# Patient Record
Sex: Female | Born: 1995 | Race: Black or African American | Hispanic: No | Marital: Single | Smoking: Never smoker
Health system: Southern US, Community
[De-identification: ages and names within clinical notes are randomized; demographics above are authoritative.]

## PROBLEM LIST (undated history)

## (undated) DIAGNOSIS — R011 Cardiac murmur, unspecified: Secondary | ICD-10-CM

## (undated) DIAGNOSIS — T7840XA Allergy, unspecified, initial encounter: Secondary | ICD-10-CM

## (undated) DIAGNOSIS — E785 Hyperlipidemia, unspecified: Secondary | ICD-10-CM

## (undated) DIAGNOSIS — I10 Essential (primary) hypertension: Secondary | ICD-10-CM

## (undated) HISTORY — DX: Cardiac murmur, unspecified: R01.1

## (undated) HISTORY — DX: Allergy, unspecified, initial encounter: T78.40XA

---
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
## 2015-06-22 ENCOUNTER — Ambulatory Visit (INDEPENDENT_AMBULATORY_CARE_PROVIDER_SITE_OTHER): Payer: 59 | Admitting: Family Medicine

## 2015-06-22 VITALS — BP 120/82 | HR 80 | Temp 99.0°F | Resp 16 | Ht 67.5 in | Wt 221.0 lb

## 2015-06-22 DIAGNOSIS — R300 Dysuria: Secondary | ICD-10-CM | POA: Diagnosis not present

## 2015-06-22 DIAGNOSIS — R011 Cardiac murmur, unspecified: Secondary | ICD-10-CM | POA: Diagnosis not present

## 2015-06-22 LAB — POC MICROSCOPIC URINALYSIS (UMFC): MUCUS RE: ABSENT

## 2015-06-22 LAB — POCT URINALYSIS DIP (MANUAL ENTRY)
BILIRUBIN UA: NEGATIVE
BILIRUBIN UA: NEGATIVE
GLUCOSE UA: NEGATIVE
Nitrite, UA: POSITIVE — AB
Protein Ur, POC: 100 — AB
SPEC GRAV UA: 1.015
UROBILINOGEN UA: 1
pH, UA: 7

## 2015-06-22 MED ORDER — NITROFURANTOIN MONOHYD MACRO 100 MG PO CAPS: 100.0000 mg | ORAL_CAPSULE | Freq: Two times a day (BID) | ORAL | Status: DC

## 2015-06-22 MED ORDER — PHENAZOPYRIDINE HCL 100 MG PO TABS: 100.0000 mg | ORAL_TABLET | Freq: Three times a day (TID) | ORAL | Status: DC | PRN

## 2015-06-22 NOTE — Patient Instructions (Addendum)
   IF you received an x-ray today, you will receive an invoice from [facility]. Please contact [facility] at [phone] with questions or concerns regarding your invoice.   IF you received labwork today, you will receive an invoice from [facility]. Please contact [facility] at [phone] with questions or concerns regarding your invoice.   Our billing staff will not be able to assist you with questions regarding bills from these companies.  You will be contacted with the lab results as soon as they are available. The fastest way to get your results is to activate your My Chart account. Instructions are located on the last page of this paperwork. If you have not heard from us regarding the results in 2 weeks, please contact this office.     Urinary Tract Infection Urinary tract infections (UTIs) can develop anywhere along your urinary tract. Your urinary tract is your body's drainage system for removing wastes and extra water. Your urinary tract includes two kidneys, two ureters, a bladder, and a urethra. Your kidneys are a pair of bean-shaped organs. Each kidney is about the size of your fist. They are located below your ribs, one on each side of your spine. CAUSES Infections are caused by microbes, which are microscopic organisms, including fungi, viruses, and bacteria. These organisms are so small that they can only be seen through a microscope. Bacteria are the microbes that most commonly cause UTIs. SYMPTOMS  Symptoms of UTIs may vary by age and gender of the patient and by the location of the infection. Symptoms in young women typically include a frequent and intense urge to urinate and a painful, burning feeling in the bladder or urethra during urination. Older women and men are more likely to be tired, shaky, and weak and have muscle aches and abdominal pain. A fever may mean the infection is in your kidneys. Other symptoms of a kidney  infection include pain in your back or sides below the ribs, nausea, and vomiting. DIAGNOSIS To diagnose a UTI, your caregiver will ask you about your symptoms. Your caregiver will also ask you to provide a urine sample. The urine sample will be tested for bacteria and white blood cells. White blood cells are made by your body to help fight infection. TREATMENT  Typically, UTIs can be treated with medication. Because most UTIs are caused by a bacterial infection, they usually can be treated with the use of antibiotics. The choice of antibiotic and length of treatment depend on your symptoms and the type of bacteria causing your infection. HOME CARE INSTRUCTIONS  If you were prescribed antibiotics, take them exactly as your caregiver instructs you. Finish the medication even if you feel better after you have only taken some of the medication.  Drink enough water and fluids to keep your urine clear or pale yellow.  Avoid caffeine, tea, and carbonated beverages. They tend to irritate your bladder.  Empty your bladder often. Avoid holding urine for long periods of time.  Empty your bladder before and after sexual intercourse.  After a bowel movement, women should cleanse from front to back. Use each tissue only once. SEEK MEDICAL CARE IF:   You have back pain.  You develop a fever.  Your symptoms do not begin to resolve within 3 days. SEEK IMMEDIATE MEDICAL CARE IF:   You have severe back pain or lower abdominal pain.  You develop chills.  You have nausea or vomiting.  You have continued burning or discomfort with urination.   MAKE SURE YOU:   Understand these instructions.  Will watch your condition.  Will get help right away if you are not doing well or get worse.   This information is not intended to replace advice given to you by your health care provider. Make sure you discuss any questions you have with your health care provider.   Document Released: 11/20/2004 Document  Revised: 11/01/2014 Document Reviewed: 03/21/2011 Elsevier Interactive Patient Education 2016 Elsevier Inc.  

## 2015-06-22 NOTE — Progress Notes (Addendum)
Subjective:    Patient ID: Claudia Arnold, female    DOB: [birth year], 20 y.o.   MRN: [id number]  06/22/2015  Dysuria and Urinary Frequency   HPI This [age] y.o. female presents for evaluation of dysuria and urinary frequency.  Onset four days ago with urinary frequency; no dysuria; +urgency; +frequency; no hematuria.  Nocturia x 0.  Heavy sleeper.  Wears a pad at bedtime.  No leakage.  NO fever/chills/sweats.  +lower abdominal pain after urination.  No n/v.  No lower back pain.  LMP last week; normal.  NO vaginal discharge, irritation, or burning.  Sexually active; old partner.  Same partner since age 20.   PCP: Chel[facility] at [facility] [place]  Heart murmur: [place]  Echo was normal.     Review of Systems  Constitutional: Negative for fever, chills, diaphoresis and fatigue.  Eyes: Negative for visual disturbance.  Respiratory: Negative for cough and shortness of breath.   Cardiovascular: Negative for chest pain, palpitations and leg swelling.  Gastrointestinal: Negative for nausea, vomiting, abdominal pain, diarrhea and constipation.  Endocrine: Negative for cold intolerance, heat intolerance, polydipsia, polyphagia and polyuria.  Genitourinary: Positive for urgency, frequency and pelvic pain. Negative for dysuria, hematuria, flank pain, decreased urine volume, vaginal bleeding, vaginal discharge, difficulty urinating, genital sores, vaginal pain, menstrual problem and dyspareunia.  Neurological: Negative for dizziness, tremors, seizures, syncope, facial asymmetry, speech difficulty, weakness, light-headedness, numbness and headaches.    Past Medical History  Diagnosis Date  . Heart murmur   . Allergy    History reviewed. No pertinent past surgical history. No Known Allergies  Social History   Social History  . Marital Status: Single    Spouse Name: N/A  . Number of Children: N/A  . Years of Education: N/A   Occupational  History  . Not on file.   Social History Main Topics  . Smoking status: Never Smoker   . Smokeless tobacco: Not on file  . Alcohol Use: No  . Drug Use: No  . Sexual Activity: Not on file   Other Topics Concern  . Not on file   Social History Narrative   Family History  Problem Relation Age of Onset  . Hypertension Mother   . High Cholesterol Mother        Objective:    BP 120/82 mmHg  Pulse 80  Temp(Src) 99 F (37.2 C) (Oral)  Resp 16  Ht 5' 7.5" (1.715 m)  Wt 221 lb (100.245 kg)  BMI 34.08 kg/m2  SpO2 99%  LMP 06/12/2015 Physical Exam  Constitutional: She is oriented to person, place, and time. She appears well-developed and well-nourished. No distress.  HENT:  Head: Normocephalic and atraumatic.  Right Ear: External ear normal.  Left Ear: External ear normal.  Nose: Nose normal.  Mouth/Throat: Oropharynx is clear and moist.  Eyes: Conjunctivae and EOM are normal. Pupils are equal, round, and reactive to light.  Neck: Normal range of motion. Neck supple. Carotid bruit is not present. No thyromegaly present.  Cardiovascular: Normal rate, regular rhythm and intact distal pulses.  Exam reveals no gallop and no friction rub.   Murmur heard.  Systolic murmur is present with a grade of 2/6  Pulmonary/Chest: Effort normal and breath sounds normal. She has no wheezes. She has no rales.  Abdominal: Soft. Bowel sounds are normal. She exhibits no distension and no mass. There is no tenderness. There is no rebound, no guarding and no CVA tenderness.  Lymphadenopathy:    She has no cervical adenopathy.  Neurological: She is alert and oriented to person, place, and time. No cranial nerve deficit.  Skin: Skin is warm and dry. No rash noted. She is not diaphoretic. No erythema. No pallor.  Psychiatric: She has a normal mood and affect. Her behavior is normal.   Results for orders placed or performed in visit on 06/22/15  POCT urinalysis dipstick  Result Value Ref Range    Color, UA yellow yellow   Clarity, UA cloudy (A) clear   Glucose, UA negative negative   Bilirubin, UA negative negative   Ketones, POC UA negative negative   Spec Grav, UA 1.015    Blood, UA moderate (A) negative   pH, UA 7.0    Protein Ur, POC =100 (A) negative   Urobilinogen, UA 1.0    Nitrite, UA Positive (A) Negative   Leukocytes, UA Trace (A) Negative  POCT Microscopic Urinalysis ([facility])  Result Value Ref Range   WBC,UR,HPF,POC Too numerous to count  (A) None WBC/hpf   RBC,UR,HPF,POC Moderate (A) None RBC/hpf   Bacteria Many (A) None, Too numerous to count   Mucus Absent Absent   Epithelial Cells, UR Per Microscopy Few (A) None, Too numerous to count cells/hpf       Assessment & Plan:   1. Dysuria   2. Heart murmur    -New. -Rx for Macrobid, pyridium provided. -increase water intake. -RTC fever/flank pain/vomiting. -s/p echo in [place] in past five years; reports benign etiology.   Orders Placed This Encounter  Procedures  . Urine culture  . POCT urinalysis dipstick  . POCT Microscopic Urinalysis ([facility])   Meds ordered this encounter  Medications  . nitrofurantoin, macrocrystal-monohydrate, (MACROBID) 100 MG capsule    Sig: Take 1 capsule (100 mg total) by mouth 2 (two) times daily.    Dispense:  14 capsule    Refill:  0  . phenazopyridine (PYRIDIUM) 100 MG tablet    Sig: Take 1 tablet (100 mg total) by mouth 3 (three) times daily as needed for pain.    Dispense:  6 tablet    Refill:  0    Return if symptoms worsen or fail to improve.    Malikah Lakey Paulita Fujita, M.D. [facility] [address] [place], [place]  [address] [phone] phone [phone] fax

## 2015-06-24 LAB — URINE CULTURE: Colony Count: >=100000

## 2016-06-12 ENCOUNTER — Ambulatory Visit (INDEPENDENT_AMBULATORY_CARE_PROVIDER_SITE_OTHER): Payer: 59 | Admitting: Emergency Medicine

## 2016-06-12 ENCOUNTER — Encounter: Payer: Self-pay | Admitting: Emergency Medicine

## 2016-06-12 VITALS — BP 150/95 | HR 78 | Temp 98.9°F | Ht 65.75 in | Wt 226.0 lb

## 2016-06-12 DIAGNOSIS — R059 Cough, unspecified: Secondary | ICD-10-CM | POA: Insufficient documentation

## 2016-06-12 DIAGNOSIS — R05 Cough: Secondary | ICD-10-CM

## 2016-06-12 DIAGNOSIS — J209 Acute bronchitis, unspecified: Secondary | ICD-10-CM | POA: Diagnosis not present

## 2016-06-12 MED ORDER — AZITHROMYCIN 250 MG PO TABS: ORAL_TABLET | ORAL | 0 refills | Status: DC

## 2016-06-12 MED ORDER — PROMETHAZINE-CODEINE 6.25-10 MG/5ML PO SYRP: 5.0000 mL | ORAL_SOLUTION | Freq: Every evening | ORAL | 0 refills | Status: DC | PRN

## 2016-06-12 NOTE — Progress Notes (Signed)
Claudia Arnold 21 y.o.   Chief Complaint  Patient presents with  . Cough    x 1 week - productive - clear  . chest congestion    pt hears crackling when coughing    HISTORY OF PRESENT ILLNESS: This is a 21 y.o. female complaining of 1 week h/o cough and chest congestion.  Cough  This is a new problem. The current episode started in the past 7 days. The problem has been gradually worsening. The problem occurs every few minutes. The cough is productive of sputum. Associated symptoms include nasal congestion. Pertinent negatives include no chest pain, chills, ear congestion, eye redness, fever, headaches, hemoptysis, myalgias, rash, sore throat, shortness of breath or wheezing. Associated symptoms comments: Chest congestion. She has tried OTC cough suppressant (Mucinex DM) for the symptoms.     Prior to Admission medications   Medication Sig Start Date End Date Taking? Authorizing Provider  dextromethorphan-guaiFENesin (MUCINEX DM) 30-600 MG 12hr tablet Take 2 tablets by mouth 2 (two) times daily.   Yes Historical Provider, MD  fexofenadine-pseudoephedrine (ALLEGRA-D 24) 180-240 MG 24 hr tablet Take 1 tablet by mouth daily as needed.   Yes Historical Provider, MD  norgestimate-ethinyl estradiol (ORTHO-CYCLEN,SPRINTEC,PREVIFEM) 0.25-35 MG-MCG tablet Take 1 tablet by mouth daily.   Yes Historical Provider, MD    No Known Allergies  Patient Active Problem List   Diagnosis Date Noted  . 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 Allergy   . Heart murmur     No past surgical history on file.  Social History   Social History  . Marital status: Single    Spouse name: N/A  . Number of children: N/A  . Years of education: N/A   Occupational History  . Not on file.   Social History Main Topics  . Smoking status: Never Smoker  . Smokeless tobacco: Never Used  . Alcohol use No  . Drug use: No  . Sexual activity: Not on file   Other Topics Concern    . Not on file   Social History Narrative  . No narrative on file    Family History  Problem Relation Age of Onset  . Hypertension Mother   . High Cholesterol Mother      Review of Systems  Constitutional: Negative.  Negative for chills and fever.  HENT: Positive for congestion. Negative for nosebleeds and sore throat.   Eyes: Negative.  Negative for blurred vision, double vision, discharge and redness.  Respiratory: Positive for cough. Negative for hemoptysis, shortness of breath and wheezing.   Cardiovascular: Negative for chest pain.  Gastrointestinal: Negative.  Negative for abdominal pain, diarrhea, nausea and vomiting.  Genitourinary: Negative for dysuria and hematuria.  Musculoskeletal: Negative for back pain, myalgias and neck pain.  Skin: Negative for rash.  Neurological: Negative for dizziness and headaches.  Endo/Heme/Allergies: Negative.   All other systems reviewed and are negative.   Vitals:   06/12/16 0953 06/12/16 1000  BP: (!) 155/105 (!) 150/95  Pulse: 78   Temp: 98.9 F (37.2 C)     Physical Exam  Constitutional: She is oriented to person, place, and time. She appears well-developed and well-nourished.  HENT:  Head: Normocephalic and atraumatic.  Nose: Nose normal.  Mouth/Throat: Oropharynx is clear and moist.  Eyes: Conjunctivae and EOM are normal. Pupils are equal, round, and reactive to light.  Neck: Normal range of motion. Neck supple. No JVD present. No thyromegaly present.  Cardiovascular: Normal  rate, regular rhythm and normal heart sounds.   Pulmonary/Chest: Effort normal and breath sounds normal.  Abdominal: Soft. She exhibits no distension. There is no tenderness.  Musculoskeletal: Normal range of motion.  Lymphadenopathy:    She has no cervical adenopathy.  Neurological: She is alert and oriented to person, place, and time. No sensory deficit. She exhibits normal muscle tone. Coordination normal.  Skin: Skin is warm and dry. Capillary  refill takes less than 2 seconds. No rash noted.  Psychiatric: She has a normal mood and affect. Her behavior is normal.  Vitals reviewed.    ASSESSMENT & PLAN: Ashlinn was seen today for cough and chest congestion.  Diagnoses and all orders for this visit:  Acute bronchitis, unspecified organism  Cough  Other orders -     azithromycin (ZITHROMAX) 250 MG tablet; Sig as indicated -     promethazine-codeine (PHENERGAN WITH CODEINE) 6.25-10 MG/5ML syrup; Take 5 mLs by mouth at bedtime as needed for cough.    Patient Instructions       IF you received an x-ray today, you will receive an invoice from [facility]. Please contact [facility] at [phone] with questions or concerns regarding your invoice.   IF you received labwork today, you will receive an invoice from [facility]. Please contact [facility] at [phone] with questions or concerns regarding your invoice.   Our billing staff will not be able to assist you with questions regarding bills from these companies.  You will be contacted with the lab results as soon as they are available. The fastest way to get your results is to activate your My Chart account. Instructions are located on the last page of this paperwork. If you have not heard from [place] regarding the results in 2 weeks, please contact this office.       Acute Bronchitis, Adult Acute bronchitis is when air tubes (bronchi) in the lungs suddenly get swollen. The condition can make it hard to breathe. It can also cause these symptoms:  A cough.  Coughing up clear, yellow, or green mucus.  Wheezing.  Chest congestion.  Shortness of breath.  A fever.  Body aches.  Chills.  A sore throat. Follow these instructions at home: Medicines   Take over-the-counter and prescription medicines only as told by your doctor.  If you were prescribed an antibiotic medicine, take it as told by your doctor. Do not stop taking the antibiotic  even if you start to feel better. General instructions   Rest.  Drink enough fluids to keep your pee (urine) clear or pale yellow.  Avoid smoking and secondhand smoke. If you smoke and you need help quitting, ask your doctor. Quitting will help your lungs heal faster.  Use an inhaler, cool mist vaporizer, or humidifier as told by your doctor.  Keep all follow-up visits as told by your doctor. This is important. How is this prevented? To lower your risk of getting this condition again:  Wash your hands often with soap and water. If you cannot use soap and water, use hand sanitizer.  Avoid contact with people who have cold symptoms.  Try not to touch your hands to your mouth, nose, or eyes.  Make sure to get the flu shot every year. Contact a doctor if:  Your symptoms do not get better in 2 weeks. Get help right away if:  You cough up blood.  You have chest pain.  You have very bad shortness of breath.  You become dehydrated.  You faint (  pass out) or keep feeling like you are going to pass out.  You keep throwing up (vomiting).  You have a very bad headache.  Your fever or chills gets worse. This information is not intended to replace advice given to you by your health care provider. Make sure you discuss any questions you have with your health care provider. Document Released: 07/30/2007 Document Revised: 09/19/2015 Document Reviewed: 08/01/2015 Elsevier Interactive Patient Education  2017 Elsevier Inc.      Edwina Barth, MD [facility]

## 2016-06-12 NOTE — Patient Instructions (Addendum)
     IF you received an x-ray today, you will receive an invoice from [facility]. Please contact [facility] at [phone] with questions or concerns regarding your invoice.   IF you received labwork today, you will receive an invoice from [facility]. Please contact [facility] at [phone] with questions or concerns regarding your invoice.   Our billing staff will not be able to assist you with questions regarding bills from these companies.  You will be contacted with the lab results as soon as they are available. The fastest way to get your results is to activate your My Chart account. Instructions are located on the last page of this paperwork. If you have not heard from us regarding the results in 2 weeks, please contact this office.      Acute Bronchitis, Adult Acute bronchitis is when air tubes (bronchi) in the lungs suddenly get swollen. The condition can make it hard to breathe. It can also cause these symptoms:  A cough.  Coughing up clear, yellow, or green mucus.  Wheezing.  Chest congestion.  Shortness of breath.  A fever.  Body aches.  Chills.  A sore throat.  Follow these instructions at home: Medicines  Take over-the-counter and prescription medicines only as told by your doctor.  If you were prescribed an antibiotic medicine, take it as told by your doctor. Do not stop taking the antibiotic even if you start to feel better. General instructions  Rest.  Drink enough fluids to keep your pee (urine) clear or pale yellow.  Avoid smoking and secondhand smoke. If you smoke and you need help quitting, ask your doctor. Quitting will help your lungs heal faster.  Use an inhaler, cool mist vaporizer, or humidifier as told by your doctor.  Keep all follow-up visits as told by your doctor. This is important. How is this prevented? To lower your risk of getting this condition again:  Wash your hands often with soap and water. If you cannot  use soap and water, use hand sanitizer.  Avoid contact with people who have cold symptoms.  Try not to touch your hands to your mouth, nose, or eyes.  Make sure to get the flu shot every year.  Contact a doctor if:  Your symptoms do not get better in 2 weeks. Get help right away if:  You cough up blood.  You have chest pain.  You have very bad shortness of breath.  You become dehydrated.  You faint (pass out) or keep feeling like you are going to pass out.  You keep throwing up (vomiting).  You have a very bad headache.  Your fever or chills gets worse. This information is not intended to replace advice given to you by your health care provider. Make sure you discuss any questions you have with your health care provider. Document Released: 07/30/2007 Document Revised: 09/19/2015 Document Reviewed: 08/01/2015 Elsevier Interactive Patient Education  2017 Elsevier Inc.  

## 2017-03-25 ENCOUNTER — Other Ambulatory Visit: Payer: Self-pay | Admitting: Nurse Practitioner

## 2017-03-25 DIAGNOSIS — M545 Low back pain: Secondary | ICD-10-CM

## 2017-04-01 ENCOUNTER — Ambulatory Visit (HOSPITAL_COMMUNITY)
Admission: RE | Admit: 2017-04-01 | Discharge: 2017-04-01 | Disposition: A | Discharge status: Home / Self Care | Payer: 59 | Source: Ambulatory Visit | Attending: Nurse Practitioner | Admitting: Nurse Practitioner

## 2017-04-01 ENCOUNTER — Ambulatory Visit: Payer: 59

## 2017-04-01 DIAGNOSIS — M47896 Other spondylosis, lumbar region: Secondary | ICD-10-CM | POA: Insufficient documentation

## 2017-04-01 DIAGNOSIS — M545 Low back pain: Secondary | ICD-10-CM | POA: Diagnosis not present

## 2017-04-01 DIAGNOSIS — M5127 Other intervertebral disc displacement, lumbosacral region: Secondary | ICD-10-CM | POA: Diagnosis not present

## 2018-10-12 IMAGING — MR MR LUMBAR SPINE W/O CM
4 of 5 series · 19 of 48 positions shown · non-contrast
Comparison: None.

CLINICAL DATA: Low back pain extending to the lower extremities.
Symptoms worsened since [REDACTED].

EXAM:
MRI LUMBAR SPINE WITHOUT CONTRAST
TECHNIQUE: Multiplanar, multisequence MR imaging of the lumbar spine was
performed. No intravenous contrast was administered.

[Series 3: T1 · sagittal · 4.0mm · 0.51mm/px · 3 of 12 slices shown (1 of 2)]
[im 3/12]
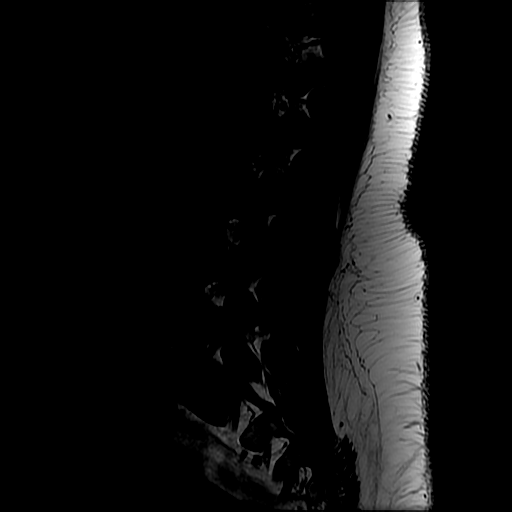
[im 7/12]
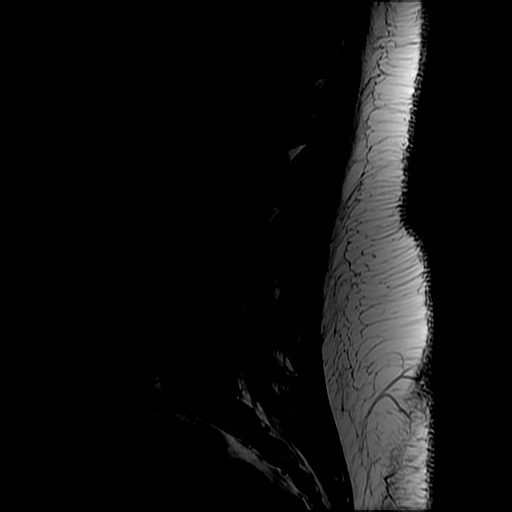
[im 12/12]
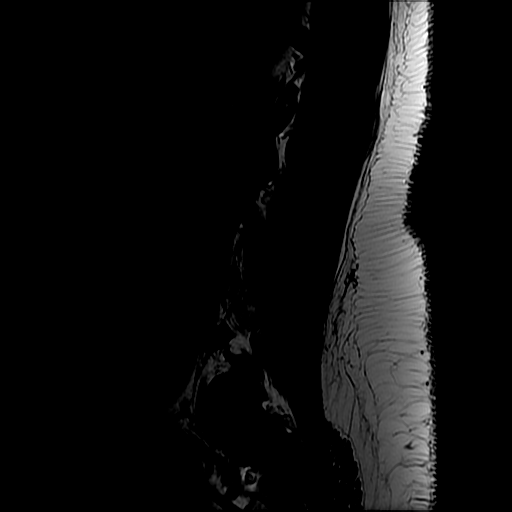

[Series 4: T2 · sagittal · 4.0mm · 0.51mm/px · 5 of 12 slices shown (1 of 2)]
[im 1/12]
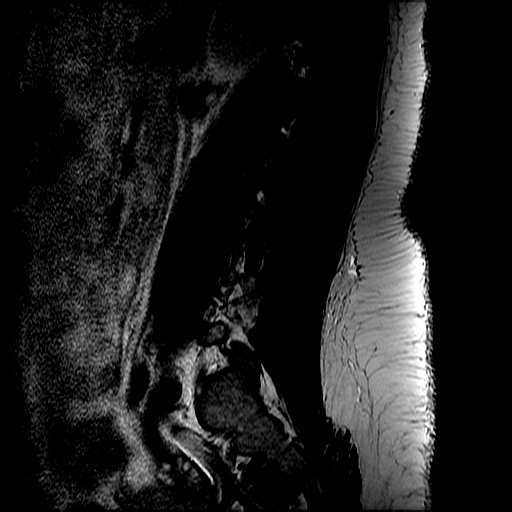
[im 3/12]
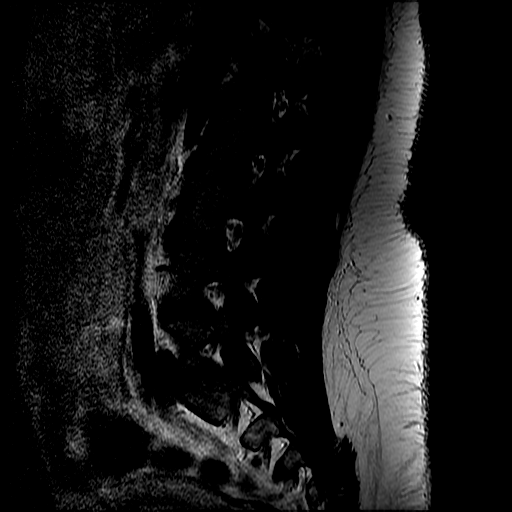
[im 6/12]
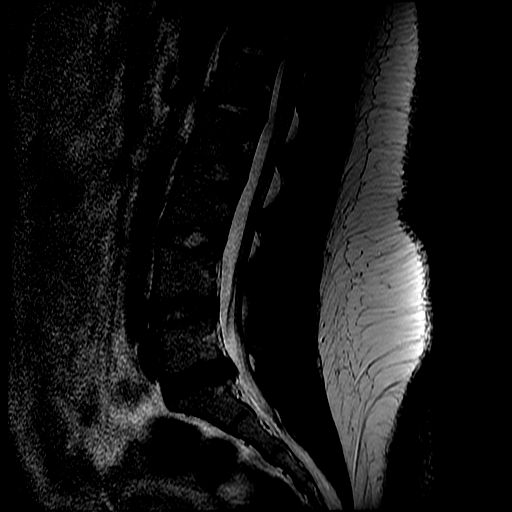
[im 9/12]
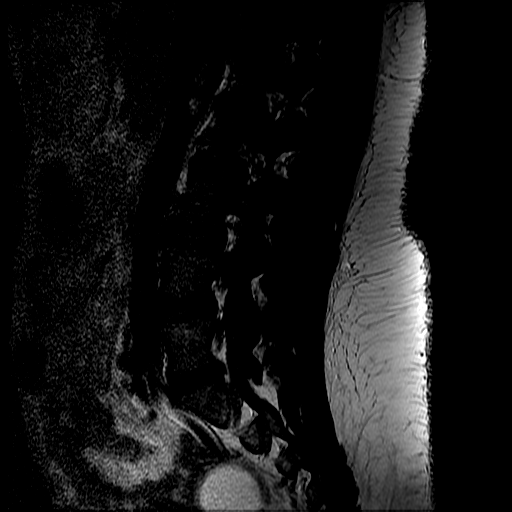
[im 12/12]
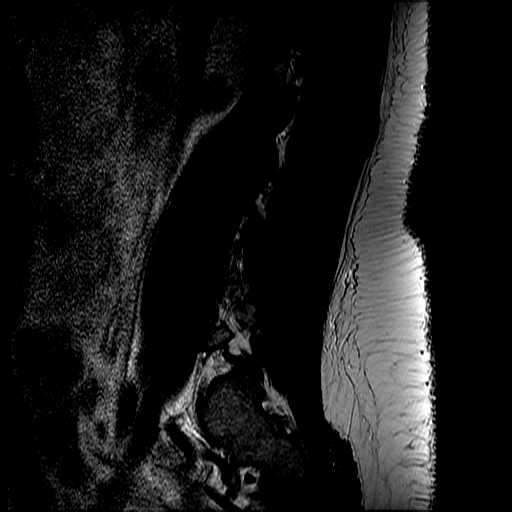

[Series 6: T2 · axial · 4.0mm · 0.39mm/px · z∈[-41,+118]mm · 8 of 35 slices shown (2 of 2)]
[im 3/35]
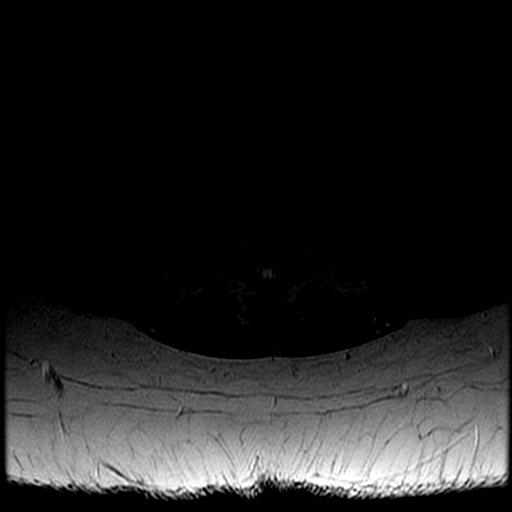
[im 5/35]
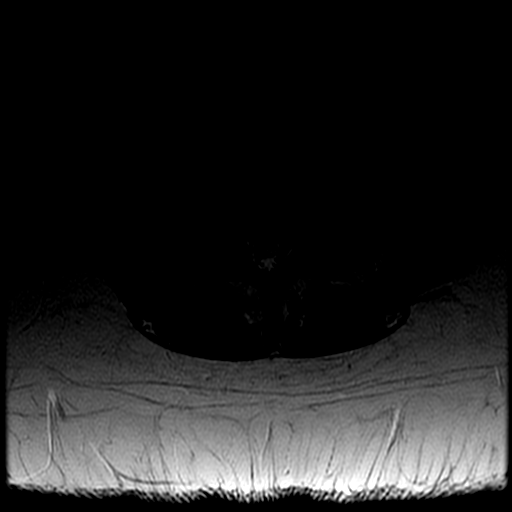
[im 7/35]
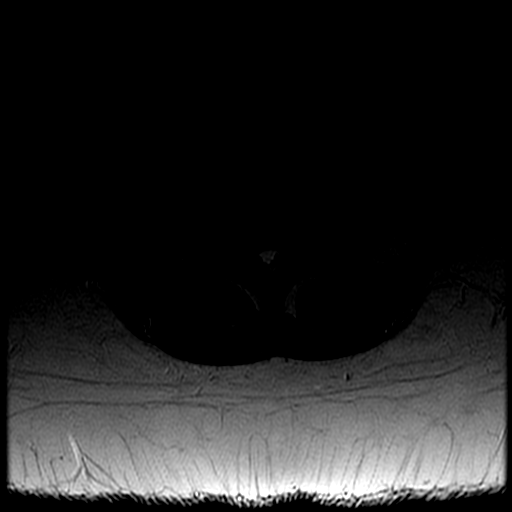
[im 12/35]
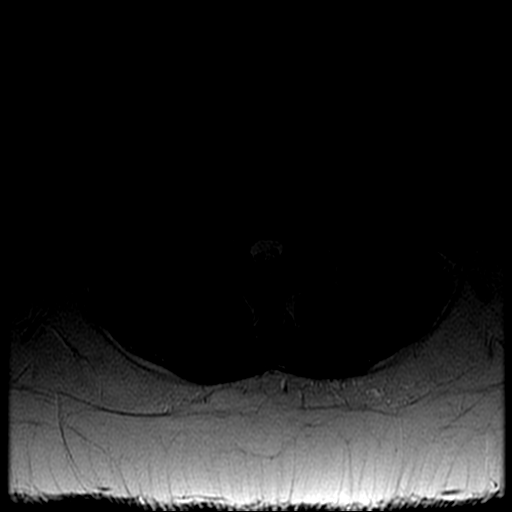
[im 16/35]
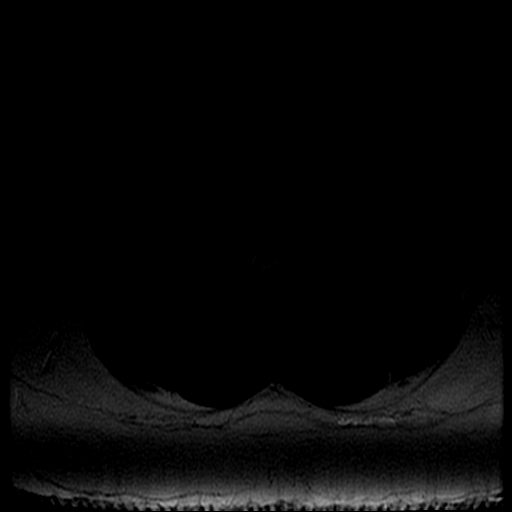
[im 19/35]
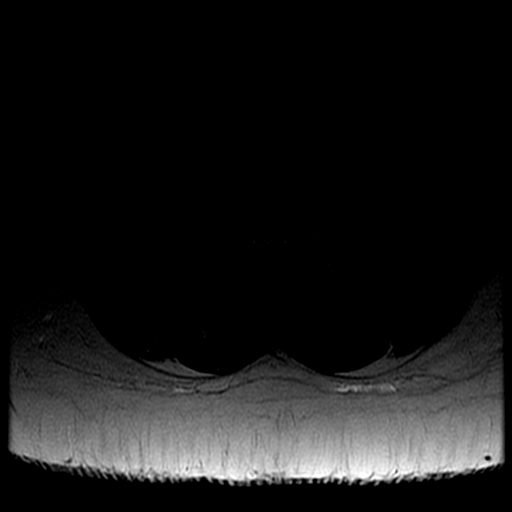
[im 21/35]
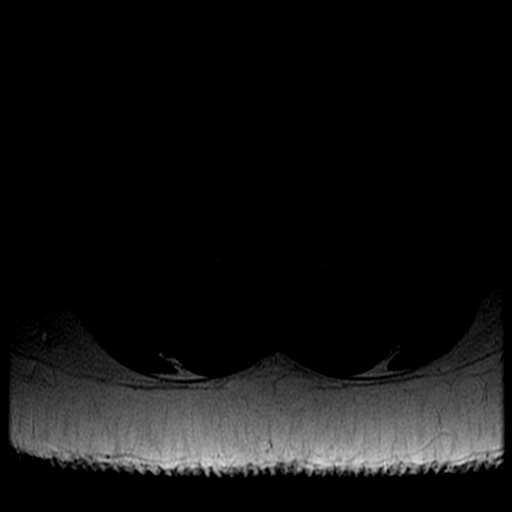
[im 30/35]
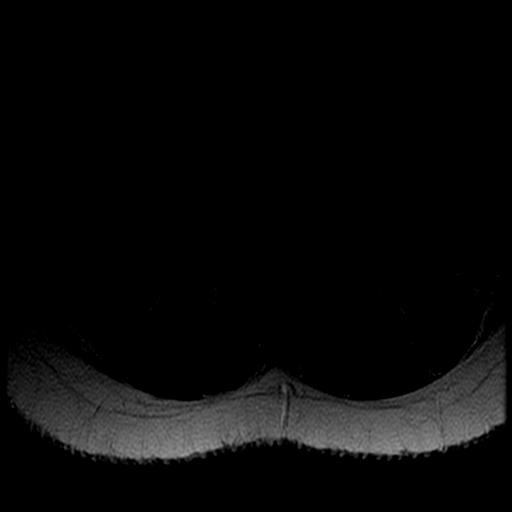

[Series 7: T1 · axial · 4.0mm · 0.39mm/px · z∈[-32,+118]mm · 3 of 35 slices shown (2 of 2)]
[im 5/35]
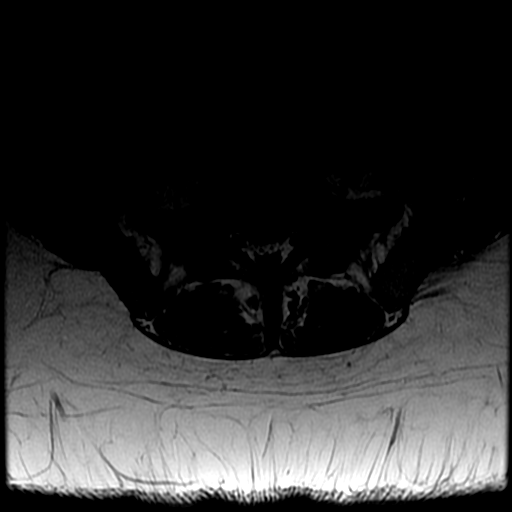
[im 19/35]
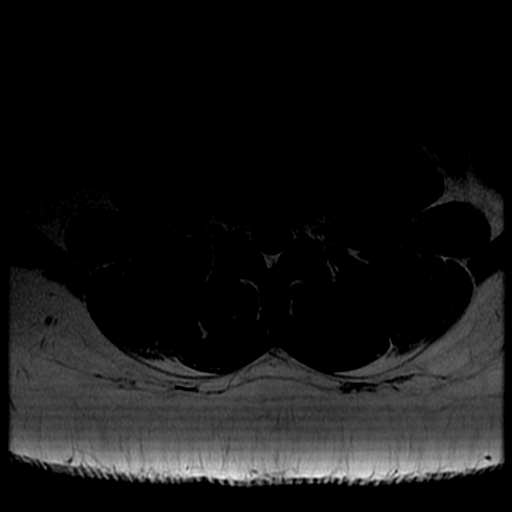
[im 30/35]
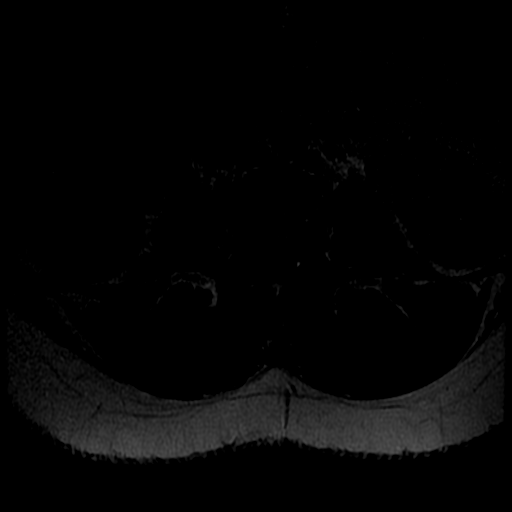

[19 of 48 positions shown; findings below may reference images not displayed]

FINDINGS: Segmentation:  5 lumbar type vertebral bodies.

Alignment:  Normal

Vertebrae:  Normal

Conus medullaris and cauda equina: Conus extends to the L1-2 level.
Conus and cauda equina appear normal.

Paraspinal and other soft tissues: Negative

Disc levels:

No abnormality at L3-4 or above.

L4-5: No disc abnormality. Mild facet hypertrophy without edema. No
stenosis.

L5-S1: Disc degeneration with a right posterolateral disc herniation
that could compress the right S1 nerve. Mild facet arthritis at this
level.
IMPRESSION: Right posterolateral disc herniation at L5-S1 that could compress
the right S1 nerve.

Mild facet osteoarthritis at L4-5 and L5-S1 that could contribute to
back pain.

## 2019-05-13 ENCOUNTER — Ambulatory Visit: Payer: 59 | Attending: Internal Medicine

## 2019-05-13 DIAGNOSIS — Z23 Encounter for immunization: Secondary | ICD-10-CM

## 2019-05-13 NOTE — Progress Notes (Signed)
   Covid-19 Vaccination Clinic  Name:  Claudia Arnold    MRN: [id number] DOB: May 18, 1995  05/13/2019  [name] was observed post Covid-19 immunization for 15 minutes without incident. She was provided with Vaccine Information Sheet and instruction to access the V-Safe system.   [name] was instructed to call 911 with any severe reactions post vaccine: Marland Kitchen Difficulty breathing  . Swelling of face and throat  . A fast heartbeat  . A bad rash all over body  . Dizziness and weakness   Immunizations Administered    Name Date Dose VIS Date Route   Pfizer COVID-19 Vaccine 05/13/2019 12:04 PM 0.3 mL 02/04/2019 Intramuscular   Manufacturer: [employer], [employer]   Lot: [id number]   NDC: [address]-2

## 2019-06-03 ENCOUNTER — Ambulatory Visit: Payer: 59 | Attending: Internal Medicine

## 2019-06-03 DIAGNOSIS — Z23 Encounter for immunization: Secondary | ICD-10-CM

## 2019-06-03 NOTE — Progress Notes (Signed)
   Covid-19 Vaccination Clinic  Name:  Claudia Arnold    MRN: [id number] DOB: Oct 15, 1995  06/03/2019  Claudia Arnold was observed post Covid-19 immunization for 15 minutes without incident. She was provided with Vaccine Information Sheet and instruction to access the V-Safe system.   Claudia Arnold was instructed to call 911 with any severe reactions post vaccine: Marland Kitchen Difficulty breathing  . Swelling of face and throat  . A fast heartbeat  . A bad rash all over body  . Dizziness and weakness   Immunizations Administered    Name Date Dose VIS Date Route   Pfizer COVID-19 Vaccine 06/03/2019 12:05 PM 0.3 mL 02/04/2019 Intramuscular   Manufacturer: [employer], [employer]   Lot: [id number]   NDC: [address]-2

## 2022-02-04 LAB — RESULTS CONSOLE HPV: CHL HPV: NEGATIVE

## 2022-02-04 LAB — OB RESULTS CONSOLE GC/CHLAMYDIA: Chlamydia: NEGATIVE

## 2022-02-04 LAB — HM PAP SMEAR

## 2022-04-18 ENCOUNTER — Encounter (HOSPITAL_COMMUNITY): Payer: Self-pay

## 2022-04-18 ENCOUNTER — Ambulatory Visit (HOSPITAL_COMMUNITY)
Admission: RE | Admit: 2022-04-18 | Discharge: 2022-04-18 | Disposition: A | Discharge status: Home / Self Care | Payer: Self-pay | Source: Ambulatory Visit

## 2022-04-18 VITALS — BP 132/81 | HR 81 | Temp 98.9°F | Resp 16 | Ht 65.75 in | Wt 245.0 lb

## 2022-04-18 DIAGNOSIS — M5412 Radiculopathy, cervical region: Secondary | ICD-10-CM

## 2022-04-18 HISTORY — DX: Hyperlipidemia, unspecified: E78.5

## 2022-04-18 HISTORY — DX: Essential (primary) hypertension: I10

## 2022-04-18 NOTE — Discharge Instructions (Addendum)
Wear the brace during the day for support. Try the mouse/keyboard pillow while using the computer Try ibuprofen or tylenol for anti-inflammatory properties  Once daily zyrtec can help with itching  I recommend to follow with the orthopedic specialists for further evaluation if symptoms persist

## 2022-04-18 NOTE — ED Triage Notes (Signed)
Chief Complaint: "I have been experiencing tingling on my left side. It started with my neck and then moved to my upper arm and now it sits in my thumb on my left hand. The left side of my face is also irritated. I am consistently scratching and picking at my face. - Entered by patient" T Tingling and pain starting in the left neck going into the shoulder, arm, and left hand. States the left side of  the face is broken out, itching, and tingling as well.   Onset: few weeks  Prescriptions or OTC medications tried: No    Sick exposure: No  New foods, medications, or products: No  Recent Travel: No

## 2022-04-18 NOTE — ED Provider Notes (Addendum)
[facility]    CSN: [id number] Arrival date & time: 04/18/22  1517      History   Chief Complaint Chief Complaint  Patient presents with   Hand Problem    I have been experiencing tingling on my left side. It started with my neck and then moved to my upper arm and now it sits in my thumb on my left hand. The left side of my face is also irritated. I am consistently scratching and picking at my face. - Entered by patient    HPI Claudia Arnold is a 27 y.o. female.  Presents with left arm tingling sensation for a week or so At first felt from the neck into upper arm. That resolved and now feels in wrist and thumb Left side only. No weakness. Denies neck/back pain or injury Also reports left side of face is itching, irritated, "breaking out". 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 No pertinent surgical history.  OB History   No obstetric history on file.      Home Medications    Prior to Admission medications   Medication Sig Start Date End Date Taking? Authorizing Provider  amLODipine (NORVASC) 2.5 MG tablet Take 2.5 mg by mouth daily. 06/08/21  Yes [provider]  chlorthalidone (HYGROTON) 25 MG tablet Take 25 mg by mouth daily.   Yes [provider]  losartan (COZAAR) 100 MG tablet Take 100 mg by mouth daily.   Yes [provider]  norgestimate-ethinyl estradiol (ORTHO-CYCLEN,SPRINTEC,PREVIFEM) 0.25-35 MG-MCG tablet Take 1 tablet by mouth daily.   Yes [provider]  rosuvastatin (CRESTOR) 20 MG tablet Take 20 mg by mouth at bedtime.   Yes [provider]    Family History Family History  Problem Relation Age of Onset    Hypertension Mother    High Cholesterol Mother     Social History Social History   Tobacco Use   Smoking status: Never   Smokeless tobacco: Never  Vaping Use   Vaping Use: Never used  Substance Use Topics   Alcohol use: No   Drug use: No     Allergies   Patient has no known allergies.   Review of Systems Review of Systems As per HPI  Physical Exam Triage Vital Signs ED Triage Vitals  Enc Vitals Group     BP 04/18/22 1559 132/81     Pulse Rate 04/18/22 1559 81     Resp 04/18/22 1559 16     Temp 04/18/22 1559 98.9 F (37.2 C)     Temp Source 04/18/22 1559 Oral     SpO2 04/18/22 1604 99 %     Weight 04/18/22 1559 245 lb (111.1 kg)     Height 04/18/22 1559 5' 5.75" (1.67 m)     Head Circumference --      Peak Flow --      Pain Score 04/18/22 1555 6     Pain Loc --      Pain Edu? --      Excl. in [place]? --    No data found.  Updated Vital Signs BP 132/81 (BP Location: Left Arm)  Pulse 81   Temp 98.9 F (37.2 C) (Oral)   Resp 16   Ht 5' 5.75" (1.67 m)   Wt 245 lb (111.1 kg)   LMP 03/30/2022 (Exact Date)   SpO2 99%   BMI 39.85 kg/m    Physical Exam Vitals and nursing note reviewed.  Constitutional:      General: She is not in acute distress. HENT:     Nose: Nose normal.     Mouth/Throat:     Mouth: Mucous membranes are moist.     Pharynx: Oropharynx is clear.  Eyes:     Extraocular Movements: Extraocular movements intact.     Conjunctiva/sclera: Conjunctivae normal.     Pupils: Pupils are equal, round, and reactive to light.  Cardiovascular:     Rate and Rhythm: Normal rate and regular rhythm.     Heart sounds: Normal heart sounds.  Pulmonary:     Effort: Pulmonary effort is normal.     Breath sounds: Normal breath sounds.  Musculoskeletal:        General: Normal range of motion.     Cervical back: Full passive range of motion without pain. No rigidity. No spinous process tenderness. Normal range of motion.  Skin:    Findings: No rash.      Comments: No rash noted on face, neck, arms   Neurological:     General: No focal deficit present.     Mental Status: She is alert and oriented to person, place, and time.     Cranial Nerves: Cranial nerves 2-12 are intact.     Sensory: Sensation is intact.     Motor: Motor function is intact. No weakness.     Coordination: Coordination is intact.     Deep Tendon Reflexes: Reflexes are normal and symmetric.     Comments: Grip strength 5/5 bilat. Sensation intact. Cap refill < 2 seconds, strong radial pulses. Full ROM at bilat shoulders, elbows, wrists. Negative tinel and phalen. Full ROM of neck and back. No spinal tenderness.      UC Treatments / Results  Labs (all labs ordered are listed, but only abnormal results are displayed) Labs Reviewed - No data to display  EKG  Radiology No results found.  Procedures Procedures (including critical care time)  Medications Ordered in UC Medications - No data to display  Initial Impression / Assessment and Plan / UC Course  I have reviewed the triage vital signs and the nursing notes.  Pertinent labs & imaging results that were available during my care of the patient were reviewed by me and considered in my medical decision making (see chart for details).  Vitals stable. Well appearing Applied wrist brace for support. Discussed possible nerve etiology, radiculopathy, carpal tunnel No red flags at this time. I recommend follow with ortho if symptoms persist Can try zyrtec 10 mg daily for itching, facial moisturizer   Final Clinical Impressions(s) / UC Diagnoses   Final diagnoses:  Cervical radiculopathy     Discharge Instructions      Wear the brace during the day for support. Try the mouse/keyboard pillow while using the computer Try ibuprofen or tylenol for anti-inflammatory properties  Once daily zyrtec can help with itching  I recommend to follow with the orthopedic specialists for further evaluation if symptoms  persist      ED Prescriptions   None    PDMP not reviewed this encounter.      Elliyah Liszewski, [place] Gu[place], [place] 04/18/22 1705

## 2022-05-12 ENCOUNTER — Encounter: Payer: Self-pay | Admitting: Neurology

## 2022-05-12 ENCOUNTER — Other Ambulatory Visit: Payer: Self-pay

## 2022-05-12 DIAGNOSIS — R202 Paresthesia of skin: Secondary | ICD-10-CM

## 2022-06-05 ENCOUNTER — Ambulatory Visit (INDEPENDENT_AMBULATORY_CARE_PROVIDER_SITE_OTHER): Payer: BC Managed Care – PPO | Admitting: Neurology

## 2022-06-05 DIAGNOSIS — R202 Paresthesia of skin: Secondary | ICD-10-CM | POA: Diagnosis not present

## 2022-06-05 DIAGNOSIS — G5602 Carpal tunnel syndrome, left upper limb: Secondary | ICD-10-CM

## 2022-06-05 NOTE — Procedures (Signed)
  [facility]  [address] [place], [address]  [place], [place] [address] Tel: [phone] Fax: [phone] Test Date:  06/05/2022  Patient: Claudia Arnold DOB: 05/19/1995 Physician: Nita Sickle, DO  Sex: Female Height: 5\' 6"  Ref Phys: Margart Sickles PA-C  ID#: [id number]   Technician:    History: This is a 27 year old female referred for evaluation of left hand numbness/tingling.  NCV & EMG Findings: Extensive electrodiagnostic testing of the left upper extremity shows:  Left mixed palmar sensory responses show mildly prolonged latency.  Left median and ulnar sensory responses are within normal limits. Left median and ulnar motor responses are within normal limits. There is no evidence of active or chronic motor axonal loss changes affecting any of the tested muscles.  Motor unit configuration and recruitment pattern is within normal limits.   Impression: Left median neuropathy at or distal to the wrist, consistent with a clinical diagnosis of carpal tunnel syndrome.  Overall, these findings are very mild in degree electrically.   ___________________________ Nita Sickle, DO    Nerve Conduction Studies   Stim Site NR Peak (ms) Norm Peak (ms) O-P Amp (V) Norm O-P Amp  Left Median Anti Sensory (2nd Digit)  33 C  Wrist    2.9 <3.3 48.2 >20  Left Ulnar Anti Sensory (5th Digit)  33 C  Wrist    2.7 <3.0 53.3 >18     Stim Site NR Onset (ms) Norm Onset (ms) O-P Amp (mV) Norm O-P Amp Site1 Site2 Delta-0 (ms) Dist (cm) Vel (m/s) Norm Vel (m/s)  Left Median Motor (Abd Poll Brev)  33 C  Wrist    2.8 <3.9 12.5 >6 Elbow Wrist 4.7 31.0 66 >51  Elbow    7.5  11.0         Left Ulnar Motor (Abd Dig Minimi)  33 C  Wrist    1.8 <3.0 8.8 >8 B Elbow Wrist 3.5 24.0 69 >51  B Elbow    5.3  8.5  A Elbow B Elbow 1.5 10.0 67 >51  A Elbow    6.8  8.5            Stim Site NR Peak (ms) Norm Peak (ms) P-T Amp (V) Site1 Site2 Delta-P (ms) Norm Delta (ms)  Left Median/Ulnar Palm  Comparison (Wrist - 8cm)  33 C  Median Palm    1.8 <2.2 57.0 Median Palm Ulnar Palm 0.4   Ulnar Palm    1.4 <2.2 16.4       Electromyography   Side Muscle Ins.Act Fibs Fasc Recrt Amp Dur Poly Activation Comment  Left 1stDorInt Nml Nml Nml Nml Nml Nml Nml Nml N/A  Left Abd Poll Brev Nml Nml Nml Nml Nml Nml Nml Nml N/A  Left PronatorTeres Nml Nml Nml Nml Nml Nml Nml Nml N/A  Left Biceps Nml Nml Nml Nml Nml Nml Nml Nml N/A  Left Triceps Nml Nml Nml Nml Nml Nml Nml Nml N/A  Left Deltoid Nml Nml Nml Nml Nml Nml Nml Nml N/A      Waveforms:

## 2022-08-27 ENCOUNTER — Other Ambulatory Visit: Payer: Self-pay | Admitting: Nurse Practitioner

## 2022-09-15 NOTE — H&P (Signed)
PREOPERATIVE H&P  Chief Complaint: LEFT CARPAL TUNNEL SYNDROME  HPI: Claudia Arnold is a 27 y.o. female who presents with a diagnosis of LEFT CARPAL TUNNEL SYNDROME. Symptoms are rated as moderate to severe, and have been worsening.  This is significantly impairing activities of daily living.  She has elected for surgical management.   Past Medical History:  Diagnosis Date   Allergy    Heart murmur    Hyperlipemia    Hypertension    No past surgical history on file. Social History   Socioeconomic History   Marital status: Single    Spouse name: Not on file   Number of children: Not on file   Years of education: Not on file   Highest education level: Not on file  Occupational History   Not on file  Tobacco Use   Smoking status: Never   Smokeless tobacco: Never  Vaping Use   Vaping status: Never Used  Substance and Sexual Activity   Alcohol use: No   Drug use: No   Sexual activity: Yes    Birth control/protection: Pill  Other Topics Concern   Not on file  Social History Narrative   Not on file   Social Determinants of Health   Financial Resource Strain: Not on file  Food Insecurity: Not on file  Transportation Needs: Not on file  Physical Activity: Not on file  Stress: Not on file  Social Connections: Not on file   Family History  Problem Relation Age of Onset   Hypertension Mother    High Cholesterol Mother    No Known Allergies Prior to Admission medications   Medication Sig Start Date End Date Taking? Authorizing Provider  amLODipine (NORVASC) 2.5 MG tablet Take 2.5 mg by mouth daily. 06/08/21   [provider]  chlorthalidone (HYGROTON) 25 MG tablet Take 25 mg by mouth daily.    [provider]  losartan (COZAAR) 100 MG tablet Take 100 mg by mouth daily.    [provider]  norgestimate-ethinyl estradiol (ORTHO-CYCLEN,SPRINTEC,PREVIFEM) 0.25-35 MG-MCG tablet Take 1 tablet by mouth daily.    [provider]  NURTEC 75  MG TBDP DISSOLVE ONE TABLET ON THE TONGUE AT THE TIME OF MIGRAINE. DO NOT REPEAT FOR 48 HOURS 08/28/22   Miki Kins, FNP  rosuvastatin (CRESTOR) 20 MG tablet Take 20 mg by mouth at bedtime.    [provider]     Positive ROS: All other systems have been reviewed and were otherwise negative with the exception of those mentioned in the HPI and as above.  Physical Exam: General: Alert, no acute distress Cardiovascular: No pedal edema Respiratory: No cyanosis, no use of accessory musculature GI: No organomegaly, abdomen is soft and non-tender Skin: No lesions in the area of chief complaint Neurologic: Sensation intact distally Psychiatric: Patient is competent for consent with normal mood and affect Lymphatic: No axillary or cervical lymphadenopathy  MUSCULOSKELETAL: TTP left wrist, ROM slightly painful but intact, + Phalen's, + Tineals, NVI   Imaging: n/a   Assessment: LEFT CARPAL TUNNEL SYNDROME  Plan: Plan for Procedure(s): CARPAL TUNNEL RELEASE ENDOSCOPIC  The risks benefits and alternatives were discussed with the patient including but not limited to the risks of nonoperative treatment, versus surgical intervention including infection, bleeding, nerve injury,  blood clots, cardiopulmonary complications, morbidity, mortality, among others, and they were willing to proceed.   Weightbearing: WBAT LUE Orthopedic devices: none Showering: POD 3 Dressing: reinforce PRN Medicines: Ultram, Tylenol, Gabapentin  Discharge: home Follow up:  10/20/22 at 12:45pm    Jenne Pane, PA-C Office [phone] 09/15/2022 4:34 PM

## 2022-09-21 ENCOUNTER — Other Ambulatory Visit: Payer: Self-pay | Admitting: Nurse Practitioner

## 2022-09-22 ENCOUNTER — Other Ambulatory Visit: Payer: Self-pay

## 2022-09-22 MED ORDER — NORGESTIMATE-ETH ESTRADIOL 0.25-35 MG-MCG PO TABS: 1.0000 | ORAL_TABLET | Freq: Every day | ORAL | 3 refills | Status: DC

## 2022-09-29 ENCOUNTER — Encounter (HOSPITAL_BASED_OUTPATIENT_CLINIC_OR_DEPARTMENT_OTHER): Payer: Self-pay | Admitting: Orthopedic Surgery

## 2022-09-29 ENCOUNTER — Other Ambulatory Visit: Payer: Self-pay

## 2022-10-03 ENCOUNTER — Encounter (HOSPITAL_BASED_OUTPATIENT_CLINIC_OR_DEPARTMENT_OTHER)
Admission: RE | Admit: 2022-10-03 | Discharge: 2022-10-03 | Disposition: A | Discharge status: Home / Self Care | Payer: BC Managed Care – PPO | Source: Ambulatory Visit | Attending: Orthopedic Surgery | Admitting: Orthopedic Surgery

## 2022-10-03 ENCOUNTER — Other Ambulatory Visit: Payer: Self-pay

## 2022-10-03 DIAGNOSIS — Z01810 Encounter for preprocedural cardiovascular examination: Secondary | ICD-10-CM | POA: Diagnosis not present

## 2022-10-03 DIAGNOSIS — Z01812 Encounter for preprocedural laboratory examination: Secondary | ICD-10-CM | POA: Insufficient documentation

## 2022-10-03 DIAGNOSIS — Z79899 Other long term (current) drug therapy: Secondary | ICD-10-CM | POA: Diagnosis not present

## 2022-10-03 DIAGNOSIS — Z01818 Encounter for other preprocedural examination: Secondary | ICD-10-CM | POA: Diagnosis present

## 2022-10-03 LAB — BASIC METABOLIC PANEL
Anion gap: 6 (ref 5–15)
BUN: 10 mg/dL (ref 6–20)
CO2: 22 mmol/L (ref 22–32)
Calcium: 8.6 mg/dL — ABNORMAL LOW (ref 8.9–10.3)
Chloride: 105 mmol/L (ref 98–111)
Creatinine, Ser: 0.8 mg/dL (ref 0.44–1.00)
GFR, Estimated: >60 mL/min (ref >60)
Glucose, Bld: 89 mg/dL (ref 70–99)
Potassium: 4.4 mmol/L (ref 3.5–5.1)
Sodium: 133 mmol/L — ABNORMAL LOW (ref 135–145)

## 2022-10-06 NOTE — Anesthesia Preprocedure Evaluation (Signed)
Anesthesia Evaluation  Patient identified by MRN, date of birth, ID band Patient awake    Reviewed: Allergy & Precautions, H&P , NPO status , Patient's Chart, lab work & pertinent test results  Airway Mallampati: I  TM Distance: >3 FB Neck ROM: Full    Dental no notable dental hx. (+) Teeth Intact, Dental Advisory Given   Pulmonary neg pulmonary ROS   Pulmonary exam normal breath sounds clear to auscultation       Cardiovascular Exercise Tolerance: Good hypertension, Pt. on medications negative cardio ROS Normal cardiovascular exam+ Valvular Problems/Murmurs  Rhythm:Regular Rate:Normal     Neuro/Psych negative neurological ROS  negative psych ROS   GI/Hepatic negative GI ROS, Neg liver ROS,,,  Endo/Other  negative endocrine ROS    Renal/GU negative Renal ROS  negative genitourinary   Musculoskeletal negative musculoskeletal ROS (+)    Abdominal   Peds negative pediatric ROS (+)  Hematology negative hematology ROS (+)   Anesthesia Other Findings   Reproductive/Obstetrics negative OB ROS                             Anesthesia Physical Anesthesia Plan  ASA: 2  Anesthesia Plan: General   Post-op Pain Management: Minimal or no pain anticipated, Tylenol PO (pre-op)* and Celebrex PO (pre-op)*   Induction: Intravenous  PONV Risk Score and Plan: 3 and Ondansetron, Dexamethasone and Treatment may vary due to age or medical condition  Airway Management Planned: LMA  Additional Equipment: None  Intra-op Plan:   Post-operative Plan:   Informed Consent: I have reviewed the patients History and Physical, chart, labs and discussed the procedure including the risks, benefits and alternatives for the proposed anesthesia with the patient or authorized representative who has indicated his/her understanding and acceptance.       Plan Discussed with: CRNA and Anesthesiologist  Anesthesia  Plan Comments: ( )       Anesthesia Quick Evaluation

## 2022-10-07 ENCOUNTER — Encounter (HOSPITAL_BASED_OUTPATIENT_CLINIC_OR_DEPARTMENT_OTHER): Payer: Self-pay | Admitting: Orthopedic Surgery

## 2022-10-07 ENCOUNTER — Encounter (HOSPITAL_BASED_OUTPATIENT_CLINIC_OR_DEPARTMENT_OTHER)
Admission: RE | Disposition: A | Discharge status: Home / Self Care | Payer: Self-pay | Source: Home / Self Care | Attending: Orthopedic Surgery

## 2022-10-07 ENCOUNTER — Ambulatory Visit (HOSPITAL_BASED_OUTPATIENT_CLINIC_OR_DEPARTMENT_OTHER): Payer: BC Managed Care – PPO | Admitting: Certified Registered"

## 2022-10-07 ENCOUNTER — Ambulatory Visit (HOSPITAL_BASED_OUTPATIENT_CLINIC_OR_DEPARTMENT_OTHER)
Admission: RE | Admit: 2022-10-07 | Discharge: 2022-10-07 | Disposition: A | Discharge status: Home / Self Care | Payer: BC Managed Care – PPO | Attending: Orthopedic Surgery | Admitting: Orthopedic Surgery

## 2022-10-07 DIAGNOSIS — G5602 Carpal tunnel syndrome, left upper limb: Secondary | ICD-10-CM | POA: Insufficient documentation

## 2022-10-07 DIAGNOSIS — I10 Essential (primary) hypertension: Secondary | ICD-10-CM | POA: Diagnosis not present

## 2022-10-07 DIAGNOSIS — I498 Other specified cardiac arrhythmias: Secondary | ICD-10-CM | POA: Insufficient documentation

## 2022-10-07 DIAGNOSIS — Z79899 Other long term (current) drug therapy: Secondary | ICD-10-CM

## 2022-10-07 DIAGNOSIS — Z01818 Encounter for other preprocedural examination: Secondary | ICD-10-CM

## 2022-10-07 HISTORY — PX: CARPAL TUNNEL RELEASE: SHX101

## 2022-10-07 LAB — POCT PREGNANCY, URINE: Preg Test, Ur: NEGATIVE

## 2022-10-07 SURGERY — RELEASE, CARPAL TUNNEL, ENDOSCOPIC
Anesthesia: General | Site: Wrist | Laterality: Left

## 2022-10-07 MED ORDER — CEFAZOLIN SODIUM-DEXTROSE 2-4 GM/100ML-% IV SOLN
INTRAVENOUS | Status: AC
  Filled 2022-10-07: qty 100

## 2022-10-07 MED ORDER — GABAPENTIN 300 MG PO CAPS: 300.0000 mg | ORAL_CAPSULE | Freq: Two times a day (BID) | ORAL | 0 refills | Status: DC | PRN

## 2022-10-07 MED ORDER — BUPIVACAINE HCL (PF) 0.5 % IJ SOLN
INTRAMUSCULAR | Status: AC
  Filled 2022-10-07: qty 60

## 2022-10-07 MED ORDER — FENTANYL CITRATE (PF) 100 MCG/2ML IJ SOLN: 25.0000 ug | INTRAMUSCULAR | Status: DC | PRN

## 2022-10-07 MED ORDER — DEXAMETHASONE SODIUM PHOSPHATE 10 MG/ML IJ SOLN
INTRAMUSCULAR | Status: AC
  Filled 2022-10-07: qty 1

## 2022-10-07 MED ORDER — PHENYLEPHRINE HCL (PRESSORS) 10 MG/ML IV SOLN
INTRAVENOUS | Status: DC | PRN
  Administered 2022-10-07: 80 ug via INTRAVENOUS

## 2022-10-07 MED ORDER — GABAPENTIN 300 MG PO CAPS
300.0000 mg | ORAL_CAPSULE | Freq: Once | ORAL | Status: AC
  Administered 2022-10-07: 300 mg via ORAL

## 2022-10-07 MED ORDER — ONDANSETRON HCL 4 MG/2ML IJ SOLN
INTRAMUSCULAR | Status: DC | PRN
  Administered 2022-10-07: 4 mg via INTRAVENOUS

## 2022-10-07 MED ORDER — HYDROCODONE-ACETAMINOPHEN 5-325 MG PO TABS: 2.0000 | ORAL_TABLET | Freq: Four times a day (QID) | ORAL | 0 refills | Status: DC | PRN

## 2022-10-07 MED ORDER — ONDANSETRON HCL 4 MG/2ML IJ SOLN
INTRAMUSCULAR | Status: AC
  Filled 2022-10-07: qty 2

## 2022-10-07 MED ORDER — EPHEDRINE SULFATE (PRESSORS) 50 MG/ML IJ SOLN
INTRAMUSCULAR | Status: DC | PRN
  Administered 2022-10-07: 5 mg via INTRAVENOUS

## 2022-10-07 MED ORDER — OXYCODONE HCL 5 MG/5ML PO SOLN: 5.0000 mg | Freq: Once | ORAL | Status: DC | PRN

## 2022-10-07 MED ORDER — CEFAZOLIN SODIUM-DEXTROSE 2-4 GM/100ML-% IV SOLN
2.0000 g | INTRAVENOUS | Status: AC
  Administered 2022-10-07: 2 g via INTRAVENOUS

## 2022-10-07 MED ORDER — FENTANYL CITRATE (PF) 100 MCG/2ML IJ SOLN
INTRAMUSCULAR | Status: AC
  Filled 2022-10-07: qty 2

## 2022-10-07 MED ORDER — BUPIVACAINE HCL (PF) 0.5 % IJ SOLN
INTRAMUSCULAR | Status: DC | PRN
  Administered 2022-10-07: 5 mL

## 2022-10-07 MED ORDER — ONDANSETRON HCL 4 MG/2ML IJ SOLN: 4.0000 mg | Freq: Once | INTRAMUSCULAR | Status: DC | PRN

## 2022-10-07 MED ORDER — ACETAMINOPHEN 160 MG/5ML PO SOLN: 325.0000 mg | ORAL | Status: DC | PRN

## 2022-10-07 MED ORDER — ACETAMINOPHEN 325 MG PO TABS: 325.0000 mg | ORAL_TABLET | ORAL | Status: DC | PRN

## 2022-10-07 MED ORDER — EPHEDRINE 5 MG/ML INJ
INTRAVENOUS | Status: AC
  Filled 2022-10-07: qty 5

## 2022-10-07 MED ORDER — GLYCOPYRROLATE 0.2 MG/ML IJ SOLN
INTRAMUSCULAR | Status: DC | PRN
  Administered 2022-10-07: .2 mg via INTRAVENOUS

## 2022-10-07 MED ORDER — MIDAZOLAM HCL 5 MG/5ML IJ SOLN
INTRAMUSCULAR | Status: DC | PRN
  Administered 2022-10-07: 2 mg via INTRAVENOUS

## 2022-10-07 MED ORDER — PROPOFOL 10 MG/ML IV BOLUS
INTRAVENOUS | Status: DC | PRN
  Administered 2022-10-07: 200 mg via INTRAVENOUS

## 2022-10-07 MED ORDER — FENTANYL CITRATE (PF) 100 MCG/2ML IJ SOLN
INTRAMUSCULAR | Status: DC | PRN
  Administered 2022-10-07: 25 ug via INTRAVENOUS
  Administered 2022-10-07: 50 ug via INTRAVENOUS
  Administered 2022-10-07 (×3): 25 ug via INTRAVENOUS

## 2022-10-07 MED ORDER — CELECOXIB 200 MG PO CAPS
200.0000 mg | ORAL_CAPSULE | Freq: Once | ORAL | Status: AC
  Administered 2022-10-07: 200 mg via ORAL

## 2022-10-07 MED ORDER — GABAPENTIN 300 MG PO CAPS
ORAL_CAPSULE | ORAL | Status: AC
  Filled 2022-10-07: qty 1

## 2022-10-07 MED ORDER — ACETAMINOPHEN 500 MG PO TABS
1000.0000 mg | ORAL_TABLET | Freq: Once | ORAL | Status: AC
  Administered 2022-10-07: 1000 mg via ORAL

## 2022-10-07 MED ORDER — CELECOXIB 200 MG PO CAPS
ORAL_CAPSULE | ORAL | Status: AC
  Filled 2022-10-07: qty 1

## 2022-10-07 MED ORDER — GLYCOPYRROLATE PF 0.2 MG/ML IJ SOSY
PREFILLED_SYRINGE | INTRAMUSCULAR | Status: AC
  Filled 2022-10-07: qty 2

## 2022-10-07 MED ORDER — ACETAMINOPHEN 500 MG PO TABS
ORAL_TABLET | ORAL | Status: AC
  Filled 2022-10-07: qty 2

## 2022-10-07 MED ORDER — LIDOCAINE 2% (20 MG/ML) 5 ML SYRINGE
INTRAMUSCULAR | Status: AC
  Filled 2022-10-07: qty 5

## 2022-10-07 MED ORDER — LIDOCAINE HCL (CARDIAC) PF 100 MG/5ML IV SOSY
PREFILLED_SYRINGE | INTRAVENOUS | Status: DC | PRN
  Administered 2022-10-07: 100 mg via INTRAVENOUS

## 2022-10-07 MED ORDER — PHENYLEPHRINE 80 MCG/ML (10ML) SYRINGE FOR IV PUSH (FOR BLOOD PRESSURE SUPPORT)
PREFILLED_SYRINGE | INTRAVENOUS | Status: AC
  Filled 2022-10-07: qty 10

## 2022-10-07 MED ORDER — DEXAMETHASONE SODIUM PHOSPHATE 10 MG/ML IJ SOLN
INTRAMUSCULAR | Status: DC | PRN
  Administered 2022-10-07: 10 mg via INTRAVENOUS

## 2022-10-07 MED ORDER — LACTATED RINGERS IV SOLN: INTRAVENOUS | Status: DC

## 2022-10-07 MED ORDER — PROPOFOL 10 MG/ML IV BOLUS
INTRAVENOUS | Status: AC
  Filled 2022-10-07: qty 20

## 2022-10-07 MED ORDER — ACETAMINOPHEN 500 MG PO TABS: 1000.0000 mg | ORAL_TABLET | Freq: Once | ORAL | Status: AC

## 2022-10-07 MED ORDER — ACETAMINOPHEN 500 MG PO TABS: 1000.0000 mg | ORAL_TABLET | Freq: Three times a day (TID) | ORAL | 0 refills | Status: AC | PRN

## 2022-10-07 MED ORDER — MEPERIDINE HCL 25 MG/ML IJ SOLN: 6.2500 mg | INTRAMUSCULAR | Status: DC | PRN

## 2022-10-07 MED ORDER — MIDAZOLAM HCL 2 MG/2ML IJ SOLN
INTRAMUSCULAR | Status: AC
  Filled 2022-10-07: qty 2

## 2022-10-07 MED ORDER — OXYCODONE HCL 5 MG PO TABS: 5.0000 mg | ORAL_TABLET | Freq: Once | ORAL | Status: DC | PRN

## 2022-10-07 SURGICAL SUPPLY — 42 items
APL PRP STRL LF DISP 70% ISPRP (MISCELLANEOUS) ×1
ASMB BLDE STD STRL DISP ECTR (BLADE) ×1
BLADE SLIMLINE EXTR (BLADE) ×1 IMPLANT
BLADE SURG 15 STRL LF DISP TIS (BLADE) ×1 IMPLANT
BLADE SURG 15 STRL SS (BLADE) ×1
BNDG CMPR 5X3 KNIT ELC UNQ LF (GAUZE/BANDAGES/DRESSINGS) ×1
BNDG CMPR 9X4 STRL LF SNTH (GAUZE/BANDAGES/DRESSINGS) ×1
BNDG ELASTIC 3INX 5YD STR LF (GAUZE/BANDAGES/DRESSINGS) ×1 IMPLANT
BNDG ESMARK 4X9 LF (GAUZE/BANDAGES/DRESSINGS) IMPLANT
CHLORAPREP W/TINT 26 (MISCELLANEOUS) ×1 IMPLANT
CORD BIPOLAR FORCEPS 12FT (ELECTRODE) ×1 IMPLANT
COVER BACK TABLE 60X90IN (DRAPES) ×1 IMPLANT
CUFF TOURN SGL QUICK 18X4 (TOURNIQUET CUFF) IMPLANT
DRAPE EXTREMITY T 121X128X90 (DISPOSABLE) ×1 IMPLANT
DRAPE IMP U-DRAPE 54X76 (DRAPES) ×1 IMPLANT
DRAPE SURG 17X23 STRL (DRAPES) ×1 IMPLANT
DRSG EMULSION OIL 3X3 NADH (GAUZE/BANDAGES/DRESSINGS) ×1 IMPLANT
DRSG TEGADERM 2-3/8X2-3/4 SM (GAUZE/BANDAGES/DRESSINGS) ×1 IMPLANT
GAUZE SPONGE 4X4 12PLY STRL (GAUZE/BANDAGES/DRESSINGS) ×1 IMPLANT
GLOVE BIO SURGEON STRL SZ7.5 (GLOVE) ×1 IMPLANT
GLOVE BIOGEL PI IND STRL 7.0 (GLOVE) IMPLANT
GLOVE BIOGEL PI IND STRL 7.5 (GLOVE) ×1 IMPLANT
GLOVE BIOGEL PI IND STRL 8 (GLOVE) ×1 IMPLANT
GLOVE SURG SYN 7.5 E (GLOVE) ×1 IMPLANT
GLOVE SURG SYN 7.5 PF PI (GLOVE) ×1 IMPLANT
GOWN STRL REUS W/ TWL LRG LVL3 (GOWN DISPOSABLE) ×1 IMPLANT
GOWN STRL REUS W/ TWL XL LVL3 (GOWN DISPOSABLE) ×2 IMPLANT
GOWN STRL REUS W/TWL LRG LVL3 (GOWN DISPOSABLE) ×2
GOWN STRL REUS W/TWL XL LVL3 (GOWN DISPOSABLE) ×2
KIT ARTHRO SURG NANOEC TR (ORTHOPEDIC DISPOSABLE SUPPLIES) IMPLANT
NDL HYPO 25X1 1.5 SAFETY (NEEDLE) ×1 IMPLANT
NEEDLE HYPO 25X1 1.5 SAFETY (NEEDLE) ×1 IMPLANT
NS IRRIG 1000ML POUR BTL (IV SOLUTION) ×1 IMPLANT
PACK BASIN DAY SURGERY FS (CUSTOM PROCEDURE TRAY) ×1 IMPLANT
PAD CAST 3X4 CTTN HI CHSV (CAST SUPPLIES) IMPLANT
PADDING CAST COTTON 3X4 STRL (CAST SUPPLIES) ×1
SOL ANTI FOG 6CC (MISCELLANEOUS) IMPLANT
SUT ETHILON 3 0 PS 1 (SUTURE) ×1 IMPLANT
SYR BULB EAR ULCER 3OZ GRN STR (SYRINGE) IMPLANT
SYR CONTROL 10ML LL (SYRINGE) ×1 IMPLANT
TOWEL GREEN STERILE FF (TOWEL DISPOSABLE) ×1 IMPLANT
UNDERPAD 30X36 HEAVY ABSORB (UNDERPADS AND DIAPERS) ×1 IMPLANT

## 2022-10-07 NOTE — Op Note (Signed)
10/07/2022  8:51 AM  PATIENT:  Claudia Arnold    PRE-OPERATIVE DIAGNOSIS:  LEFT CARPAL TUNNEL SYNDROME  POST-OPERATIVE DIAGNOSIS:  Same  PROCEDURE:  ENDOSCOPIC CARPAL TUNNEL RELEASE  SURGEON:  Sheral Apley, MD  PHYSICIAN ASSISTANT: Levester Fresh, PA-C, he was present and scrubbed throughout the case, critical for completion in a timely fashion, and for retraction, instrumentation, and closure.   ANESTHESIA:   General  PREOPERATIVE INDICATIONS:  Claudia Arnold is a  27 y.o. female with a diagnosis of LEFT CARPAL TUNNEL SYNDROME who failed conservative measures and elected for surgical management.    The risks benefits and alternatives were discussed with the patient preoperatively including but not limited to the risks of infection, bleeding, nerve injury, incomplete relief of symptoms, pillar pain, cardiopulmonary complications, the need for revision surgery, among others, and the patient was willing to proceed.  OPERATIVE FINDINGS: complete CTR  OPERATIVE PROCEDURE: Patient was identified in the preoperative holding area and site was marked by me. female was transported to the operating theater and placed on the table in the supine position taking care to pad all bony prominences. After appropriate time out general anesthesia was induced and female received ancef for preoperative antibiotics. The extremity was prepped and draped in normal sterile fashion.  I made a 1 severe incision just proximal to the dominant volar wrist crease in line with the radial aspect of the small finger. Spread down to the fascia and this was incised in line with the incision. Metzenbaum scissors were then prepped passed above and below the fascia proximally to clear the nerve away from the fascia clear soft tissue plane superficial to it as well. This was then incised.  I then turned attention distally where the fascia edge was as was visible within the wound. I sequentially dilated the carpal tunnel  sweeping soft tissue off of the undersurface of the ligament which was palpable throughout each past. I then inserted the endoscope and under direct visualization was able to see the undersurface the carpal tunnel throughout the entire insertion. Wasn't visualized the distal end of the ligament I put the blade retracted the instrument incising the transverse carpal ligament. I then reinserted the endoscope and directly visualized each cut and as well as intact median nerve without harm.   Wound was then irrigated and closed with a superficial suture. A sterile dressing and splint was applied   She tolerated this well, with no complications.  POSTOPERATIVE PLAN: Keep the splint clean and dry. VTE prophylaxis will consist of ambulation and foot pump exercises and possible chemical px as indicated

## 2022-10-07 NOTE — Anesthesia Postprocedure Evaluation (Signed)
Anesthesia Post Note  Patient: Claudia Arnold  Procedure(s) Performed: ENDOSCOPIC CARPAL TUNNEL RELEASE (Left: Wrist)     Patient location during evaluation: PACU Anesthesia Type: General Level of consciousness: awake and alert Pain management: pain level controlled Vital Signs Assessment: post-procedure vital signs reviewed and stable Respiratory status: spontaneous breathing, nonlabored ventilation, respiratory function stable and patient connected to nasal cannula oxygen Cardiovascular status: blood pressure returned to baseline and stable Postop Assessment: no apparent nausea or vomiting Anesthetic complications: no   No notable events documented.  Last Vitals:  Vitals:   10/07/22 0830 10/07/22 0857  BP: (!) 141/85 (!) 140/81  Pulse: 73 (!) 57  Resp: (!) 22 20  Temp:  (!) 36.3 C  SpO2: 98% 100%    Last Pain:  Vitals:   10/07/22 0857  TempSrc:   PainSc: 0-No pain                 Dravyn Severs

## 2022-10-07 NOTE — Anesthesia Procedure Notes (Signed)
Procedure Name: LMA Insertion Date/Time: 10/07/2022 7:35 AM  Performed by: Lauralyn Primes, CRNAPre-anesthesia Checklist: Patient identified, Emergency Drugs available, Suction available and Patient being monitored Patient Re-evaluated:Patient Re-evaluated prior to induction Oxygen Delivery Method: Circle system utilized Preoxygenation: Pre-oxygenation with 100% oxygen Induction Type: IV induction Ventilation: Mask ventilation without difficulty LMA: LMA inserted LMA Size: 4.0 Number of attempts: 1 Airway Equipment and Method: Bite block Placement Confirmation: positive ETCO2 Tube secured with: Tape Dental Injury: Teeth and Oropharynx as per pre-operative assessment

## 2022-10-07 NOTE — Discharge Instructions (Addendum)
POST-OPERATIVE OPIOID TAPER INSTRUCTIONS: It is important to wean off of your opioid medication as soon as possible. If you do not need pain medication after your surgery it is ok to stop day one. Opioids include: Codeine, Hydrocodone(Norco, Vicodin), Oxycodone(Percocet, oxycontin) and hydromorphone amongst others.  Long term and even short term use of opiods can cause: Increased pain response Dependence Constipation Depression Respiratory depression And more.  Withdrawal symptoms can include Flu like symptoms Nausea, vomiting And more Techniques to manage these symptoms Hydrate well Eat regular healthy meals Stay active Use relaxation techniques(deep breathing, meditating, yoga) Do Not substitute Alcohol to help with tapering If you have been on opioids for less than two weeks and do not have pain than it is ok to stop all together.  Plan to wean off of opioids This plan should start within one week post op of your joint replacement. Maintain the same interval or time between taking each dose and first decrease the dose.  Cut the total daily intake of opioids by one tablet each day Next start to increase the time between doses. The last dose that should be eliminated is the evening dose.    No Tylenol or Ibuprofen until after 1:00pm today, if needed.   Post Anesthesia Home Care Instructions  Activity: Get plenty of rest for the remainder of the day. A responsible individual must stay with you for 24 hours following the procedure.  For the next 24 hours, DO NOT: -Drive a car -Advertising copywriter -Drink alcoholic beverages -Take any medication unless instructed by your physician -Make any legal decisions or sign important papers.  Meals: Start with liquid foods such as gelatin or soup. Progress to regular foods as tolerated. Avoid greasy, spicy, heavy foods. If nausea and/or vomiting occur, drink only clear liquids until the nausea and/or vomiting subsides. Call your  physician if vomiting continues.  Special Instructions/Symptoms: Your throat may feel dry or sore from the anesthesia or the breathing tube placed in your throat during surgery. If this causes discomfort, gargle with warm salt water. The discomfort should disappear within 24 hours.  If you had a scopolamine patch placed behind your ear for the management of post- operative nausea and/or vomiting:  1. The medication in the patch is effective for 72 hours, after which it should be removed.  Wrap patch in a tissue and discard in the trash. Wash hands thoroughly with soap and water. 2. You may remove the patch earlier than 72 hours if you experience unpleasant side effects which may include dry mouth, dizziness or visual disturbances. 3. Avoid touching the patch. Wash your hands with soap and water after contact with the patch.

## 2022-10-07 NOTE — Transfer of Care (Signed)
Immediate Anesthesia Transfer of Care Note  Patient: Marcheta Grammes  Procedure(s) Performed: ENDOSCOPIC CARPAL TUNNEL RELEASE (Left: Wrist)  Patient Location: PACU  Anesthesia Type:General  Level of Consciousness: drowsy  Airway & Oxygen Therapy: Patient Spontanous Breathing and Patient connected to face mask oxygen  Post-op Assessment: Report given to RN and Post -op Vital signs reviewed and stable  Post vital signs: Reviewed and stable  Last Vitals:  Vitals Value Taken Time  BP 125/57 10/07/22 0813  Temp    Pulse 75 10/07/22 0814  Resp 25 10/07/22 0814  SpO2 97 % 10/07/22 0814  Vitals shown include unfiled device data.  Last Pain:  Vitals:   10/07/22 0704  TempSrc: Temporal  PainSc: 0-No pain      Patients Stated Pain Goal: 3 (10/07/22 0704)  Complications: No notable events documented.

## 2022-10-07 NOTE — Interval H&P Note (Signed)
History and Physical Interval Note:  10/07/2022 6:27 AM  Claudia Arnold  has presented today for surgery, with the diagnosis of LEFT CARPAL TUNNEL SYNDROME.  The various methods of treatment have been discussed with the patient and family. After consideration of risks, benefits and other options for treatment, the patient has consented to  Procedure(s): CARPAL TUNNEL RELEASE ENDOSCOPIC (Left) as a surgical intervention.  The patient's history has been reviewed, patient examined, no change in status, stable for surgery.  I have reviewed the patient's chart and labs.  Questions were answered to the patient's satisfaction.     Sheral Apley

## 2022-10-08 ENCOUNTER — Encounter (HOSPITAL_BASED_OUTPATIENT_CLINIC_OR_DEPARTMENT_OTHER): Payer: Self-pay | Admitting: Orthopedic Surgery

## 2022-10-26 ENCOUNTER — Encounter (HOSPITAL_BASED_OUTPATIENT_CLINIC_OR_DEPARTMENT_OTHER): Payer: Self-pay | Admitting: Emergency Medicine

## 2022-10-26 ENCOUNTER — Emergency Department (HOSPITAL_BASED_OUTPATIENT_CLINIC_OR_DEPARTMENT_OTHER)
Admission: EM | Admit: 2022-10-26 | Discharge: 2022-10-26 | Disposition: A | Discharge status: Home / Self Care | Payer: BC Managed Care – PPO | Attending: Emergency Medicine | Admitting: Emergency Medicine

## 2022-10-26 ENCOUNTER — Emergency Department (HOSPITAL_BASED_OUTPATIENT_CLINIC_OR_DEPARTMENT_OTHER): Payer: BC Managed Care – PPO

## 2022-10-26 DIAGNOSIS — R0789 Other chest pain: Secondary | ICD-10-CM | POA: Insufficient documentation

## 2022-10-26 DIAGNOSIS — Z1152 Encounter for screening for COVID-19: Secondary | ICD-10-CM | POA: Diagnosis not present

## 2022-10-26 DIAGNOSIS — R519 Headache, unspecified: Secondary | ICD-10-CM | POA: Diagnosis present

## 2022-10-26 LAB — COMPREHENSIVE METABOLIC PANEL
ALT: 15 U/L (ref 0–44)
AST: 16 U/L (ref 15–41)
Albumin: 3.8 g/dL (ref 3.5–5.0)
Alkaline Phosphatase: 40 U/L (ref 38–126)
Anion gap: 9 (ref 5–15)
BUN: 14 mg/dL (ref 6–20)
CO2: 23 mmol/L (ref 22–32)
Calcium: 8.7 mg/dL — ABNORMAL LOW (ref 8.9–10.3)
Chloride: 105 mmol/L (ref 98–111)
Creatinine, Ser: 0.71 mg/dL (ref 0.44–1.00)
GFR, Estimated: >60 mL/min (ref >60)
Glucose, Bld: 84 mg/dL (ref 70–99)
Potassium: 3.5 mmol/L (ref 3.5–5.1)
Sodium: 137 mmol/L (ref 135–145)
Total Bilirubin: 0.7 mg/dL (ref 0.3–1.2)
Total Protein: 7.6 g/dL (ref 6.5–8.1)

## 2022-10-26 LAB — URINALYSIS, ROUTINE W REFLEX MICROSCOPIC
Bilirubin Urine: NEGATIVE
Glucose, UA: NEGATIVE mg/dL
Ketones, ur: NEGATIVE mg/dL
Leukocytes,Ua: NEGATIVE
Nitrite: NEGATIVE
Protein, ur: NEGATIVE mg/dL
Specific Gravity, Urine: 1.025 (ref 1.005–1.030)
pH: 7 (ref 5.0–8.0)

## 2022-10-26 LAB — CBC WITH DIFFERENTIAL/PLATELET
Abs Immature Granulocytes: 0.01 10*3/uL (ref 0.00–0.07)
Basophils Absolute: 0 10*3/uL (ref 0.0–0.1)
Basophils Relative: 0 %
Eosinophils Absolute: 0.2 10*3/uL (ref 0.0–0.5)
Eosinophils Relative: 2 %
HCT: 35.8 % — ABNORMAL LOW (ref 36.0–46.0)
Hemoglobin: 12 g/dL (ref 12.0–15.0)
Immature Granulocytes: 0 %
Lymphocytes Relative: 37 %
Lymphs Abs: 2.8 10*3/uL (ref 0.7–4.0)
MCH: 30.2 pg (ref 26.0–34.0)
MCHC: 33.5 g/dL (ref 30.0–36.0)
MCV: 90.2 fL (ref 80.0–100.0)
Monocytes Absolute: 0.5 10*3/uL (ref 0.1–1.0)
Monocytes Relative: 7 %
Neutro Abs: 4.2 10*3/uL (ref 1.7–7.7)
Neutrophils Relative %: 54 %
Platelets: 234 10*3/uL (ref 150–400)
RBC: 3.97 MIL/uL (ref 3.87–5.11)
RDW: 12.4 % (ref 11.5–15.5)
WBC: 7.7 10*3/uL (ref 4.0–10.5)
nRBC: 0 % (ref 0.0–0.2)

## 2022-10-26 LAB — URINALYSIS, MICROSCOPIC (REFLEX)

## 2022-10-26 LAB — RESP PANEL BY RT-PCR (RSV, FLU A&B, COVID)  RVPGX2
Influenza A by PCR: NEGATIVE
Influenza B by PCR: NEGATIVE
Resp Syncytial Virus by PCR: NEGATIVE
SARS Coronavirus 2 by RT PCR: NEGATIVE

## 2022-10-26 LAB — PREGNANCY, URINE: Preg Test, Ur: NEGATIVE

## 2022-10-26 LAB — D-DIMER, QUANTITATIVE: D-Dimer, Quant: <0.27 ug{FEU}/mL (ref 0.00–0.50)

## 2022-10-26 MED ORDER — KETOROLAC TROMETHAMINE 30 MG/ML IJ SOLN
30.0000 mg | Freq: Once | INTRAMUSCULAR | Status: AC
  Administered 2022-10-26: 30 mg via INTRAVENOUS
  Filled 2022-10-26: qty 1

## 2022-10-26 MED ORDER — DIPHENHYDRAMINE HCL 25 MG PO TABS: 25.0000 mg | ORAL_TABLET | Freq: Three times a day (TID) | ORAL | 0 refills | Status: DC | PRN

## 2022-10-26 MED ORDER — DIPHENHYDRAMINE HCL 50 MG/ML IJ SOLN
25.0000 mg | Freq: Once | INTRAMUSCULAR | Status: AC
  Administered 2022-10-26: 25 mg via INTRAVENOUS
  Filled 2022-10-26: qty 1

## 2022-10-26 MED ORDER — LACTATED RINGERS IV BOLUS
1000.0000 mL | Freq: Once | INTRAVENOUS | Status: AC
  Administered 2022-10-26: 1000 mL via INTRAVENOUS

## 2022-10-26 MED ORDER — IBUPROFEN 600 MG PO TABS: 600.0000 mg | ORAL_TABLET | Freq: Three times a day (TID) | ORAL | 0 refills | Status: DC | PRN

## 2022-10-26 MED ORDER — METOCLOPRAMIDE HCL 5 MG PO TABS: 10.0000 mg | ORAL_TABLET | Freq: Three times a day (TID) | ORAL | 0 refills | Status: DC | PRN

## 2022-10-26 MED ORDER — METOCLOPRAMIDE HCL 5 MG/ML IJ SOLN
10.0000 mg | Freq: Once | INTRAMUSCULAR | Status: AC
  Administered 2022-10-26: 10 mg via INTRAVENOUS
  Filled 2022-10-26: qty 2

## 2022-10-26 MED ORDER — METOCLOPRAMIDE HCL 10 MG PO TABS: 10.0000 mg | ORAL_TABLET | Freq: Three times a day (TID) | ORAL | 0 refills | Status: DC | PRN

## 2022-10-26 NOTE — ED Notes (Signed)
HA resolved, chest pain remains

## 2022-10-26 NOTE — ED Notes (Signed)
EDP at [facility], alert, NAD, calm, interactive, pt up to b/r, steady gait

## 2022-10-26 NOTE — ED Notes (Signed)
To xray via stretcher, alert, NAD, calm, interactive

## 2022-10-26 NOTE — ED Triage Notes (Signed)
Pt c/o sharp middle CP since Mon and HA since [place]; neither are responding to OTC meds

## 2022-10-26 NOTE — Discharge Instructions (Signed)
1.  You may take a combination of Reglan, ibuprofen and Benadryl for your headache.  Your headache may be a migraine however you do not have a history of migraines or formal diagnosis.  You should follow-up with a family doctor and neurologist to determine if you have migraine headaches and would benefit from regular therapy.  Return to emergency department immediately if you have new associated or concerning symptoms with your headache or suddenly worsening headache. 2.  At this time your chest pain appears likely to be musculoskeletal.  Ibuprofen may be helpful when you take it for the headache.  Return if you get sudden shortness of breath, sudden changing pain or other concerning changes.

## 2022-10-26 NOTE — ED Notes (Signed)
EDP at [facility] 

## 2022-10-26 NOTE — ED Provider Notes (Signed)
[place] [facility] AT [facility] Provider Note   CSN: [id number] Arrival date & time: 10/26/22  1344     History  Chief Complaint  Patient presents with   Chest Pain   Headache    Claudia Arnold is a 27 y.o. female.  HPI Patient reports he has a headache that started Monday, 6 days ago.  Reports the headache is concentrated to the left side of her head.  Is at her temple and forehead.  Behind the eye.  Patient describes it as a "cluster headache".  She however denies prior history of formal migraine diagnosis.  She reports she periodically gets headaches but often they are more posterior occipital and frontal.  They typically resolve on their own.  Patient denies any fever or chills with this.  She denies neck stiffness.  No nausea no vomiting and no photophobia or visual change.  Patient reports that yesterday and today she did note a lot of clear drainage from her nose.  Patient reports 4 days ago she developed chest pain that is just to the left of the sternum and focal fairly pinpoint.  She reports it is a sharp pain.  She denies associated shortness of breath and no cough.  No lower extremity swelling or calf pain.  No recent travel or personal history of PE or DVT.  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  She denies outdoor exposures to ticks or mosquitoes.  No known sick contacts.  Family history negative for brain aneurysm tumor early coronary artery disease, sudden death.    Home Medications Prior to Admission medications   Medication Sig Start Date End Date Taking? Authorizing Provider  diphenhydrAMINE (BENADRYL) 25 MG tablet Take 1 tablet (25 mg total) by mouth every 8 (eight) hours as needed. 10/26/22  Yes Arby Barrette, MD  ibuprofen (ADVIL) 600 MG tablet Take 1 tablet (600 mg total) by mouth every 8 (eight) hours as needed. 10/26/22  Yes Arby Barrette, MD  metoCLOPramide (REGLAN) 10 MG  tablet Take 1 tablet (10 mg total) by mouth every 8 (eight) hours as needed for nausea. 10/26/22  Yes Arby Barrette, MD  acetaminophen (TYLENOL) 500 MG tablet Take 2 tablets (1,000 mg total) by mouth every 8 (eight) hours as needed for mild pain or moderate pain. 10/07/22   Jenne Pane, PA-C  amLODipine (NORVASC) 2.5 MG tablet Take 2.5 mg by mouth daily. 06/08/21   [provider]  BIOTIN BEAUTY EXTRA STRENGTH PO Take by mouth.    [provider]  chlorthalidone (HYGROTON) 25 MG tablet TAKE 1 TABLET ONCE DAILY INTHE MORNING FOR BLOOD      PRESSURE 09/22/22   Scoggins, Hospital doctor, NP  Cholecalciferol (D3) 50 MCG (2000 UT) CHEW Chew by mouth.    [provider]  gabapentin (NEURONTIN) 300 MG capsule Take 1 capsule (300 mg total) by mouth 2 (two) times daily as needed (for nerve pain and inflammation). 10/07/22   Jenne Pane, PA-C  HYDROcodone-acetaminophen (NORCO/VICODIN) 5-325 MG tablet Take 2 tablets by mouth every 6 (six) hours as needed for moderate pain. 10/07/22 10/07/23  Jenne Pane, PA-C  losartan (COZAAR) 100 MG tablet TAKE 1 TABLET ONCE DAILY INTHE MORNING FOR BLOOD      PRESSURE 09/22/22   Scoggins, Amber, NP  Magnesium 400 MG CAPS Take by mouth.    [provider]  norgestimate-ethinyl estradiol (ESTARYLLA) 0.25-35 MG-MCG tablet Take 1 tablet by  mouth daily. 09/22/22   Scoggins, Amber, NP  NURTEC 75 MG TBDP DISSOLVE ONE TABLET ON THE TONGUE AT THE TIME OF MIGRAINE. DO NOT REPEAT FOR 48 HOURS 08/28/22   Miki Kins, FNP  rosuvastatin (CRESTOR) 20 MG tablet TAKE 1 TABLET NIGHTLY AT   BEDTIME FOR HIGH           CHOLESTEROL 09/22/22   Scoggins, Amber, NP  vitamin B-12 (CYANOCOBALAMIN) 100 MCG tablet Take 100 mcg by mouth daily.    [provider]  vitamin C (ASCORBIC ACID) 250 MG tablet Take 250 mg by mouth daily.    [provider]      Allergies    Patient has no known allergies.    Review of Systems   Review of Systems  Physical  Exam Updated Vital Signs BP 122/68 (BP Location: Left Arm)   Pulse 63   Temp 98.1 F (36.7 C) (Oral)   Resp 16   Ht 5' 5.5" (1.664 m)   Wt 117.3 kg   LMP 10/11/2022 (Exact Date)   SpO2 98%   BMI 42.38 kg/m  Physical Exam Constitutional:      Comments: Alert nontoxic.  Well-nourished well-developed.  No acute distress.  No respiratory distress.  HENT:     Head: Normocephalic and atraumatic.     Comments: No facial swelling.    Right Ear: Tympanic membrane normal.     Left Ear: Tympanic membrane normal.     Nose: Nose normal.     Mouth/Throat:     Mouth: Mucous membranes are moist.     Pharynx: Oropharynx is clear.     Comments: Dentition very good condition. Eyes:     Extraocular Movements: Extraocular movements intact.     Conjunctiva/sclera: Conjunctivae normal.     Pupils: Pupils are equal, round, and reactive to light.  Cardiovascular:     Rate and Rhythm: Normal rate and regular rhythm.  Pulmonary:     Effort: Pulmonary effort is normal.     Breath sounds: Normal breath sounds.  Abdominal:     General: There is no distension.     Palpations: Abdomen is soft.     Tenderness: There is no abdominal tenderness. There is no guarding.  Musculoskeletal:        General: No swelling or tenderness. Normal range of motion.     Cervical back: Neck supple. No rigidity or tenderness.     Right lower leg: No edema.     Left lower leg: No edema.  Lymphadenopathy:     Cervical: No cervical adenopathy.  Skin:    General: Skin is warm and dry.  Neurological:     General: No focal deficit present.     Mental Status: She is oriented to person, place, and time.     Motor: No weakness.     Coordination: Coordination normal.  Psychiatric:        Mood and Affect: Mood normal.     ED Results / Procedures / Treatments   Labs (all labs ordered are listed, but only abnormal results are displayed) Labs Reviewed  COMPREHENSIVE METABOLIC PANEL - Abnormal; Notable for the following  components:      Result Value   Calcium 8.7 (*)    All other components within normal limits  CBC WITH DIFFERENTIAL/PLATELET - Abnormal; Notable for the following components:   HCT 35.8 (*)    All other components within normal limits  URINALYSIS, ROUTINE W REFLEX MICROSCOPIC - Abnormal; Notable for the  following components:   Hgb urine dipstick TRACE (*)    All other components within normal limits  URINALYSIS, MICROSCOPIC (REFLEX) - Abnormal; Notable for the following components:   Bacteria, UA FEW (*)    All other components within normal limits  RESP PANEL BY RT-PCR (RSV, FLU A&B, COVID)  RVPGX2  PREGNANCY, URINE  D-DIMER, QUANTITATIVE    EKG EKG Interpretation Date/Time:  Sunday October 26 2022 14:00:38 EDT Ventricular Rate:  69 PR Interval:  142 QRS Duration:  86 QT Interval:  400 QTC Calculation: 428 R Axis:   62  Text Interpretation: Normal sinus rhythm Normal ECG When compared with ECG of 03-Oct-2022 08:54, PREVIOUS ECG IS PRESENT no sig change from previous Confirmed by Arby Barrette [phone]) on 10/26/2022 3:00:16 PM  Radiology DG Chest 2 View  Result Date: 10/26/2022 CLINICAL DATA:  Chest pain for 1 day. EXAM: CHEST - 2 VIEW COMPARISON:  Report from chest radiograph dated 04/15/1997. FINDINGS: The heart size and mediastinal contours are within normal limits. Both lungs are clear. The visualized skeletal structures are unremarkable. IMPRESSION: No active cardiopulmonary disease. Electronically Signed   By: Romona Curls M.D.   On: 10/26/2022 16:42    Procedures Procedures    Medications Ordered in ED Medications  lactated ringers bolus 1,000 mL (0 mLs Intravenous Stopped 10/26/22 1709)  ketorolac (TORADOL) 30 MG/ML injection 30 mg (30 mg Intravenous Given 10/26/22 1541)  metoCLOPramide (REGLAN) injection 10 mg (10 mg Intravenous Given 10/26/22 1541)  diphenhydrAMINE (BENADRYL) injection 25 mg (25 mg Intravenous Given 10/26/22 1540)    ED Course/ Medical Decision  Making/ A&P                                 Medical Decision Making Amount and/or Complexity of Data Reviewed Labs: ordered. Radiology: ordered.  Risk OTC drugs. Prescription drug management.   Patient presents as outlined.  She has had a combination of first headache for about 6 days and then followed by chest pain for about 2 days.  Headache is fairly localized and does not have any associated symptoms.  This was of gradual onset I have low suspicion for subarachnoid hemorrhage\aneurysm or tumor.  She has no associated symptoms.  Patient clinically well in appearance.  No meningismus.  Patient identified a history of periodic headaches with some variability in pattern.  Patient used the term "cluster headache" and migraines however by history, patient does not have formal migraine diagnosis.  I do feel this is within the differential diagnosis.  Patient got improvement of symptoms with migraine cocktail.  Will have her continue oral Reglan Benadryl ibuprofen combination for a few doses and see if it resolves completely.  Return precautions reviewed.  Patient is recommended to follow-up with PCP and possibly neurology.  Chest pain was focal and no associated symptoms.  I suspect it to be musculoskeletal.  EKG, troponin and D-dimer are without any normality.  Reviewed chest x-ray read by radiology within normal limits.  At this time patient is stable for discharge with plan in place.  Well in appearance.  Return precautions reviewed.        Final Clinical Impression(s) / ED Diagnoses Final diagnoses:  Bad headache  Atypical chest pain    Rx / DC Orders ED Discharge Orders          Ordered    metoCLOPramide (REGLAN) 10 MG tablet  Every 8 hours PRN  10/26/22 1814    diphenhydrAMINE (BENADRYL) 25 MG tablet  Every 8 hours PRN        10/26/22 1814    ibuprofen (ADVIL) 600 MG tablet  Every 8 hours PRN        10/26/22 1814              Arby Barrette, MD 10/26/22  1819

## 2022-11-25 ENCOUNTER — Other Ambulatory Visit: Payer: Self-pay | Admitting: Cardiology

## 2023-01-17 ENCOUNTER — Other Ambulatory Visit: Payer: Self-pay | Admitting: Internal Medicine

## 2023-02-06 ENCOUNTER — Other Ambulatory Visit: Payer: BC Managed Care – PPO

## 2023-02-06 DIAGNOSIS — E559 Vitamin D deficiency, unspecified: Secondary | ICD-10-CM

## 2023-02-06 DIAGNOSIS — Z131 Encounter for screening for diabetes mellitus: Secondary | ICD-10-CM

## 2023-02-06 DIAGNOSIS — Z1329 Encounter for screening for other suspected endocrine disorder: Secondary | ICD-10-CM

## 2023-02-06 DIAGNOSIS — E538 Deficiency of other specified B group vitamins: Secondary | ICD-10-CM

## 2023-02-06 DIAGNOSIS — I10 Essential (primary) hypertension: Secondary | ICD-10-CM

## 2023-02-06 DIAGNOSIS — Z13220 Encounter for screening for lipoid disorders: Secondary | ICD-10-CM

## 2023-02-07 LAB — LIPID PANEL
Chol/HDL Ratio: 2.2 {ratio} (ref 0.0–4.4)
Cholesterol, Total: 126 mg/dL (ref 100–199)
HDL: 57 mg/dL (ref >39)
LDL Chol Calc (NIH): 53 mg/dL (ref 0–99)
Triglycerides: 83 mg/dL (ref 0–149)
VLDL Cholesterol Cal: 16 mg/dL (ref 5–40)

## 2023-02-07 LAB — CBC WITH DIFF/PLATELET
Basophils Absolute: 0.1 10*3/uL (ref 0.0–0.2)
Basos: 1 %
EOS (ABSOLUTE): 0.2 10*3/uL (ref 0.0–0.4)
Eos: 3 %
Hematocrit: 39.5 % (ref 34.0–46.6)
Hemoglobin: 13.1 g/dL (ref 11.1–15.9)
Immature Grans (Abs): 0 10*3/uL (ref 0.0–0.1)
Immature Granulocytes: 0 %
Lymphocytes Absolute: 2.3 10*3/uL (ref 0.7–3.1)
Lymphs: 36 %
MCH: 31.1 pg (ref 26.6–33.0)
MCHC: 33.2 g/dL (ref 31.5–35.7)
MCV: 94 fL (ref 79–97)
Monocytes Absolute: 0.5 10*3/uL (ref 0.1–0.9)
Monocytes: 7 %
Neutrophils Absolute: 3.5 10*3/uL (ref 1.4–7.0)
Neutrophils: 53 %
Platelets: 271 10*3/uL (ref 150–450)
RBC: 4.21 x10E6/uL (ref 3.77–5.28)
RDW: 11.8 % (ref 11.7–15.4)
WBC: 6.5 10*3/uL (ref 3.4–10.8)

## 2023-02-07 LAB — CMP14+EGFR
ALT: 11 [IU]/L (ref 0–32)
AST: 13 [IU]/L (ref 0–40)
Albumin: 3.8 g/dL — ABNORMAL LOW (ref 4.0–5.0)
Alkaline Phosphatase: 46 [IU]/L (ref 44–121)
BUN/Creatinine Ratio: 11 (ref 9–23)
BUN: 9 mg/dL (ref 6–20)
Bilirubin Total: 0.8 mg/dL (ref 0.0–1.2)
CO2: 21 mmol/L (ref 20–29)
Calcium: 8.8 mg/dL (ref 8.7–10.2)
Chloride: 105 mmol/L (ref 96–106)
Creatinine, Ser: 0.83 mg/dL (ref 0.57–1.00)
Globulin, Total: 2.1 g/dL (ref 1.5–4.5)
Glucose: 79 mg/dL (ref 70–99)
Potassium: 4 mmol/L (ref 3.5–5.2)
Sodium: 140 mmol/L (ref 134–144)
Total Protein: 5.9 g/dL — ABNORMAL LOW (ref 6.0–8.5)
eGFR: 99 mL/min/{1.73_m2} (ref >59)

## 2023-02-07 LAB — VITAMIN B12: Vitamin B-12: 807 pg/mL (ref 232–1245)

## 2023-02-07 LAB — HEMOGLOBIN A1C
Est. average glucose Bld gHb Est-mCnc: 111 mg/dL
Hgb A1c MFr Bld: 5.5 % (ref 4.8–5.6)

## 2023-02-07 LAB — VITAMIN D 25 HYDROXY (VIT D DEFICIENCY, FRACTURES): Vit D, 25-Hydroxy: 21 ng/mL — ABNORMAL LOW (ref 30.0–100.0)

## 2023-02-07 LAB — TSH: TSH: 0.872 u[IU]/mL (ref 0.450–4.500)

## 2023-02-09 ENCOUNTER — Encounter: Payer: Self-pay | Admitting: Cardiology

## 2023-02-12 ENCOUNTER — Ambulatory Visit (INDEPENDENT_AMBULATORY_CARE_PROVIDER_SITE_OTHER): Payer: BC Managed Care – PPO | Admitting: Cardiology

## 2023-02-12 ENCOUNTER — Encounter: Payer: Self-pay | Admitting: Cardiology

## 2023-02-12 VITALS — BP 128/84 | HR 90 | Ht 66.0 in | Wt 267.6 lb

## 2023-02-12 DIAGNOSIS — Z0001 Encounter for general adult medical examination with abnormal findings: Secondary | ICD-10-CM | POA: Diagnosis not present

## 2023-02-12 DIAGNOSIS — R3915 Urgency of urination: Secondary | ICD-10-CM | POA: Diagnosis not present

## 2023-02-12 DIAGNOSIS — Z0000 Encounter for general adult medical examination without abnormal findings: Secondary | ICD-10-CM | POA: Insufficient documentation

## 2023-02-12 DIAGNOSIS — N898 Other specified noninflammatory disorders of vagina: Secondary | ICD-10-CM | POA: Diagnosis not present

## 2023-02-12 DIAGNOSIS — Z0130 Encounter for examination of blood pressure without abnormal findings: Secondary | ICD-10-CM

## 2023-02-12 LAB — POCT URINALYSIS DIPSTICK
Bilirubin, UA: NEGATIVE
Blood, UA: NEGATIVE
Glucose, UA: NEGATIVE
Ketones, UA: NEGATIVE
Leukocytes, UA: NEGATIVE
Nitrite, UA: NEGATIVE
Protein, UA: NEGATIVE
Spec Grav, UA: 1.015 (ref 1.010–1.025)
Urobilinogen, UA: 0.2 U/dL
pH, UA: 6 (ref 5.0–8.0)

## 2023-02-12 MED ORDER — NORGESTIMATE-ETH ESTRADIOL 0.25-35 MG-MCG PO TABS: 1.0000 | ORAL_TABLET | Freq: Every day | ORAL | 3 refills | Status: DC

## 2023-02-12 MED ORDER — VITAMIN D (ERGOCALCIFEROL) 1.25 MG (50000 UNIT) PO CAPS: 50000.0000 [IU] | ORAL_CAPSULE | ORAL | 3 refills | Status: DC

## 2023-02-12 NOTE — Progress Notes (Signed)
Complete physical exam  Patient: Claudia Arnold   DOB: Jan 02, 1996   27 y.o. Female  MRN: [id number]  Subjective:    Chief Complaint  Patient presents with   Annual Exam    CPE no PAP    Claudia Arnold is a 27 y.o. female who presents today for a complete physical exam. She reports consuming a general diet. Home exercise routine includes treadmill. Gym/ health club routine includes light weights. She generally feels well. She reports sleeping well. She does have additional problems to discuss today.   Complains of vaginal smell. Well orders swab for BV. Complains of urinary urgency and odor, UA normal. Will send for culture.  Vitamin D low, will send in supplement.  Complaining of plugged ears, will clean them.   Most recent fall risk assessment:    06/12/2016    9:58 AM  Fall Risk   Falls in the past year? No     Most recent depression screenings:    06/12/2016    9:58 AM 06/22/2015    9:05 AM  PHQ 2/9 Scores  PHQ - 2 Score 0 0    Vision:Within last year and Dental: No current dental problems and Receives regular dental care  Past Medical History:  Diagnosis Date   Allergy    Heart murmur    Hyperlipemia    Hypertension     Past Surgical History:  Procedure Laterality Date   CARPAL TUNNEL RELEASE Left 10/07/2022   Procedure: ENDOSCOPIC CARPAL TUNNEL RELEASE;  Surgeon: Sheral Apley, MD;  Location: [facility];  Service: Orthopedics;  Laterality: Left;    Family History  Problem Relation Age of Onset   Hypertension Mother    High Cholesterol Mother     Social History   Socioeconomic History   Marital status: Single    Spouse name: Not on file   Number of children: Not on file   Years of education: Not on file   Highest education level: Not on file  Occupational History   Not on file  Tobacco Use   Smoking status: Never   Smokeless tobacco: Never  Vaping Use   Vaping status: Never Used  Substance and Sexual Activity   Alcohol  use: No   Drug use: No   Sexual activity: Yes    Birth control/protection: Pill  Other Topics Concern   Not on file  Social History Narrative   Not on file   Social Drivers of Health   Financial Resource Strain: Not on file  Food Insecurity: Not on file  Transportation Needs: Not on file  Physical Activity: Not on file  Stress: Not on file  Social Connections: Not on file  Intimate Partner Violence: Not on file    Outpatient Medications Prior to Visit  Medication Sig   acetaminophen (TYLENOL) 500 MG tablet Take 2 tablets (1,000 mg total) by mouth every 8 (eight) hours as needed for mild pain or moderate pain.   amLODipine (NORVASC) 5 MG tablet TAKE 1 TABLET ONCE DAILY   (DOSE INCREASE)   BIOTIN BEAUTY EXTRA STRENGTH PO Take by mouth.   chlorthalidone (HYGROTON) 25 MG tablet TAKE 1 TABLET ONCE DAILY INTHE MORNING FOR BLOOD      PRESSURE   Cholecalciferol (D3) 50 MCG (2000 UT) CHEW Chew by mouth.   diphenhydrAMINE (BENADRYL) 25 MG tablet Take 1 tablet (25 mg total) by mouth every 8 (eight) hours as needed.   gabapentin (NEURONTIN) 300 MG capsule Take 1 capsule (  300 mg total) by mouth 2 (two) times daily as needed (for nerve pain and inflammation).   HYDROcodone-acetaminophen (NORCO/VICODIN) 5-325 MG tablet Take 2 tablets by mouth every 6 (six) hours as needed for moderate pain.   ibuprofen (ADVIL) 600 MG tablet Take 1 tablet (600 mg total) by mouth every 8 (eight) hours as needed.   losartan (COZAAR) 100 MG tablet TAKE 1 TABLET ONCE DAILY INTHE MORNING FOR BLOOD      PRESSURE   Magnesium 400 MG CAPS Take by mouth.   metoCLOPramide (REGLAN) 5 MG tablet Take 2 tablets (10 mg total) by mouth every 8 (eight) hours as needed for nausea.   NURTEC 75 MG TBDP DISSOLVE ONE TABLET ON THE TONGUE AT THE TIME OF MIGRAINE. DO NOT REPEAT FOR 48 HOURS   rosuvastatin (CRESTOR) 20 MG tablet TAKE 1 TABLET NIGHTLY AT   BEDTIME FOR HIGH           CHOLESTEROL   vitamin B-12 (CYANOCOBALAMIN) 100 MCG  tablet Take 100 mcg by mouth daily.   vitamin C (ASCORBIC ACID) 250 MG tablet Take 250 mg by mouth daily.   [DISCONTINUED] amLODipine (NORVASC) 2.5 MG tablet Take 2.5 mg by mouth daily.   [DISCONTINUED] norgestimate-ethinyl estradiol (ESTARYLLA) 0.25-35 MG-MCG tablet Take 1 tablet by mouth daily.   No facility-administered medications prior to visit.    Review of Systems  Constitutional: Negative.   HENT: Negative.    Eyes: Negative.   Respiratory: Negative.  Negative for shortness of breath.   Cardiovascular: Negative.  Negative for chest pain.  Gastrointestinal: Negative.  Negative for abdominal pain, constipation and diarrhea.  Genitourinary: Negative.   Musculoskeletal:  Negative for joint pain and myalgias.  Skin: Negative.   Neurological: Negative.  Negative for dizziness and headaches.  Endo/Heme/Allergies: Negative.   All other systems reviewed and are negative.       Objective:     BP 128/84   Pulse 90   Ht 5\' 6"  (1.676 m)   Wt 267 lb 9.6 oz (121.4 kg)   SpO2 98%   BMI 43.19 kg/m  BP Readings from Last 3 Encounters:  02/12/23 128/84  10/26/22 118/68  10/07/22 (!) 140/81      Physical Exam Vitals and nursing note reviewed.  Constitutional:      Appearance: Normal appearance. She is normal weight.  HENT:     Head: Normocephalic and atraumatic.     Right Ear: There is impacted cerumen.     Left Ear: There is impacted cerumen.     Nose: Nose normal.     Mouth/Throat:     Mouth: Mucous membranes are moist.  Eyes:     Extraocular Movements: Extraocular movements intact.     Conjunctiva/sclera: Conjunctivae normal.     Pupils: Pupils are equal, round, and reactive to light.  Cardiovascular:     Rate and Rhythm: Normal rate and regular rhythm.     Pulses: Normal pulses.     Heart sounds: Normal heart sounds.  Pulmonary:     Effort: Pulmonary effort is normal.     Breath sounds: Normal breath sounds.  Abdominal:     General: Abdomen is flat. Bowel  sounds are normal.     Palpations: Abdomen is soft.  Musculoskeletal:        General: Normal range of motion.     Cervical back: Normal range of motion.  Skin:    General: Skin is warm and dry.  Neurological:     General: No focal  deficit present.     Mental Status: She is alert and oriented to person, place, and time.  Psychiatric:        Mood and Affect: Mood normal.        Behavior: Behavior normal.        Thought Content: Thought content normal.        Judgment: Judgment normal.      Results for orders placed or performed in visit on 02/12/23  POCT Urinalysis Dipstick (81002)  Result Value Ref Range   Color, UA     Clarity, UA     Glucose, UA Negative Negative   Bilirubin, UA Negative    Ketones, UA Negative    Spec Grav, UA 1.015 1.010 - 1.025   Blood, UA Negative    pH, UA 6.0 5.0 - 8.0   Protein, UA Negative Negative   Urobilinogen, UA 0.2 0.2 or 1.0 E.U./dL   Nitrite, UA Negative    Leukocytes, UA Negative Negative   Appearance Yellow    Odor      Recent Results (from the past 2160 hours)  Hemoglobin A1c     Status: None   Collection Time: 02/06/23  9:29 AM  Result Value Ref Range   Hgb A1c MFr Bld 5.5 4.8 - 5.6 %    Comment:          Prediabetes: 5.7 - 6.4          Diabetes: >6.4          Glycemic control for adults with diabetes: <7.0    Est. average glucose Bld gHb Est-mCnc 111 mg/dL  TSH     Status: None   Collection Time: 02/06/23  9:29 AM  Result Value Ref Range   TSH 0.872 0.450 - 4.500 uIU/mL  Lipid panel     Status: None   Collection Time: 02/06/23  9:29 AM  Result Value Ref Range   Cholesterol, Total 126 100 - 199 mg/dL   Triglycerides 83 0 - 149 mg/dL   HDL 57 >41 mg/dL   VLDL Cholesterol Cal 16 5 - 40 mg/dL   LDL Chol Calc (NIH) 53 0 - 99 mg/dL   Chol/HDL Ratio 2.2 0.0 - 4.4 ratio    Comment:                                   T. Chol/HDL Ratio                                             Men  Women                               1/2  Avg.Risk  3.4    3.3                                   Avg.Risk  5.0    4.4                                2X Avg.Risk  9.6    7.1  3X Avg.Risk 23.4   11.0   CBC With Diff/Platelet     Status: None   Collection Time: 02/06/23  9:29 AM  Result Value Ref Range   WBC 6.5 3.4 - 10.8 x10E3/uL   RBC 4.21 3.77 - 5.28 x10E6/uL   Hemoglobin 13.1 11.1 - 15.9 g/dL   Hematocrit 62.9 52.8 - 46.6 %   MCV 94 79 - 97 fL   MCH 31.1 26.6 - 33.0 pg   MCHC 33.2 31.5 - 35.7 g/dL   RDW 41.3 24.4 - 01.0 %   Platelets 271 150 - 450 x10E3/uL   Neutrophils 53 Not Estab. %   Lymphs 36 Not Estab. %   Monocytes 7 Not Estab. %   Eos 3 Not Estab. %   Basos 1 Not Estab. %   Neutrophils Absolute 3.5 1.4 - 7.0 x10E3/uL   Lymphocytes Absolute 2.3 0.7 - 3.1 x10E3/uL   Monocytes Absolute 0.5 0.1 - 0.9 x10E3/uL   EOS (ABSOLUTE) 0.2 0.0 - 0.4 x10E3/uL   Basophils Absolute 0.1 0.0 - 0.2 x10E3/uL   Immature Granulocytes 0 Not Estab. %   Immature Grans (Abs) 0.0 0.0 - 0.1 x10E3/uL  CMP14+EGFR     Status: Abnormal   Collection Time: 02/06/23  9:31 AM  Result Value Ref Range   Glucose 79 70 - 99 mg/dL   BUN 9 6 - 20 mg/dL   Creatinine, Ser 2.72 0.57 - 1.00 mg/dL   eGFR 99 >53 GU/YQI/3.47   BUN/Creatinine Ratio 11 9 - 23   Sodium 140 134 - 144 mmol/L   Potassium 4.0 3.5 - 5.2 mmol/L   Chloride 105 96 - 106 mmol/L   CO2 21 20 - 29 mmol/L   Calcium 8.8 8.7 - 10.2 mg/dL   Total Protein 5.9 (L) 6.0 - 8.5 g/dL   Albumin 3.8 (L) 4.0 - 5.0 g/dL   Globulin, Total 2.1 1.5 - 4.5 g/dL   Bilirubin Total 0.8 0.0 - 1.2 mg/dL   Alkaline Phosphatase 46 44 - 121 IU/L   AST 13 0 - 40 IU/L   ALT 11 0 - 32 IU/L  Vitamin B12     Status: None   Collection Time: 02/06/23  9:31 AM  Result Value Ref Range   Vitamin B-12 807 232 - 1,245 pg/mL  Vitamin D (25 hydroxy)     Status: Abnormal   Collection Time: 02/06/23  9:31 AM  Result Value Ref Range   Vit D, 25-Hydroxy 21.0 (L) 30.0 - 100.0 ng/mL     Comment: Vitamin D deficiency has been defined by the Institute of Medicine and an Endocrine Society practice guideline as a level of serum 25-OH vitamin D less than 20 ng/mL (1,2). The Endocrine Society went on to further define vitamin D insufficiency as a level between 21 and 29 ng/mL (2). 1. IOM (Institute of Medicine). 2010. Dietary reference    intakes for calcium and D. [place]: The    [employer]. 2. Holick MF, Binkley [place], Bischoff-Ferrari HA, et al.    Evaluation, treatment, and prevention of vitamin D    deficiency: an Endocrine Society clinical practice    guideline. JCEM. 2011 Jul; 96(7):1911-30.   POCT Urinalysis Dipstick (42595)     Status: None   Collection Time: 02/12/23 10:34 AM  Result Value Ref Range   Color, UA     Clarity, UA     Glucose, UA Negative Negative   Bilirubin, UA Negative    Ketones, UA Negative  Spec Grav, UA 1.015 1.010 - 1.025   Blood, UA Negative    pH, UA 6.0 5.0 - 8.0   Protein, UA Negative Negative   Urobilinogen, UA 0.2 0.2 or 1.0 E.U./dL   Nitrite, UA Negative    Leukocytes, UA Negative Negative   Appearance Yellow    Odor          Assessment & Plan:    Routine Health Maintenance and Physical Exam  Immunization History  Administered Date(s) Administered   DTaP 07/08/1995, 09/07/1995, 11/24/1996, 02/02/1998, 06/19/2000   HPV Quadrivalent 07/28/2007, 07/27/2013   Hepatitis B 08-Nov-1995, 06/08/1995, 02/02/1998   IPV 07/08/1995, 09/07/1995, 11/24/1996, 06/19/2000   Influenza,inj,Quad PF,6+ Mos 12/01/2014   MMR 11/24/1996, 06/19/2000   Meningococcal Conjugate 07/28/2007, 07/27/2013   PFIZER(Purple Top)SARS-COV-2 Vaccination 05/13/2019, 06/03/2019   Tdap 11/16/2006   Varicella 11/24/1996, 07/28/2007    Health Maintenance  Topic Date Due   Cervical Cancer Screening (Pap smear)  Never done   DTaP/Tdap/Td (7 - Td or Tdap) 11/15/2016   COVID-19 Vaccine (3 - Pfizer risk series) 07/01/2019   INFLUENZA VACCINE   09/25/2022   HPV VACCINES  Completed   Hepatitis C Screening  Completed   HIV Screening  Completed    Discussed health benefits of physical activity, and encouraged her to engage in regular exercise appropriate for her age and condition.  Problem List Items Addressed This Visit       Other   Urinary urgency   Relevant Orders   POCT Urinalysis Dipstick (19147) (Completed)   Urine Culture   Vaginal odor   Relevant Orders   NuSwab Vaginitis Plus (VG+)   Encounter for annual health examination - Primary   Return in about 1 year (around 02/12/2024) for with Marchelle Folks.     Marisue Ivan, NP  02/12/2023   This document may have been prepared by Dragon Voice Recognition software and as such may include unintentional dictation errors.

## 2023-02-14 LAB — NUSWAB VAGINITIS PLUS (VG+)
Candida albicans, NAA: NEGATIVE
Candida glabrata, NAA: NEGATIVE
Chlamydia trachomatis, NAA: NEGATIVE
Neisseria gonorrhoeae, NAA: NEGATIVE
Trich vag by NAA: POSITIVE — AB

## 2023-02-15 LAB — URINE CULTURE

## 2023-02-16 ENCOUNTER — Encounter: Payer: Self-pay | Admitting: Cardiology

## 2023-02-16 ENCOUNTER — Other Ambulatory Visit: Payer: Self-pay | Admitting: Cardiology

## 2023-02-16 ENCOUNTER — Other Ambulatory Visit: Payer: Self-pay | Admitting: Internal Medicine

## 2023-02-16 MED ORDER — METRONIDAZOLE 500 MG PO TABS: 2000.0000 mg | ORAL_TABLET | Freq: Once | ORAL | 0 refills | Status: AC

## 2023-02-16 MED ORDER — AMOXICILLIN 500 MG PO CAPS: 500.0000 mg | ORAL_CAPSULE | Freq: Two times a day (BID) | ORAL | 0 refills | Status: AC

## 2023-02-24 ENCOUNTER — Encounter: Payer: Self-pay | Admitting: Family

## 2023-02-24 ENCOUNTER — Telehealth: Payer: Self-pay | Admitting: Cardiology

## 2023-02-24 NOTE — Telephone Encounter (Signed)
 Patient called in and states that she tested positive for trich the other day. Her boyfriend went to [facility] and got tested - he received his results yesterday and he was negative for trich. He admitted that he had sex with someone else in September but Claudia Arnold has not been with anyone else. They are wondering how come he's coming back negative and she's positive. Please advise.

## 2023-03-11 ENCOUNTER — Other Ambulatory Visit: Payer: Self-pay | Admitting: Family

## 2023-03-12 ENCOUNTER — Other Ambulatory Visit: Payer: Self-pay

## 2023-03-12 MED ORDER — ROSUVASTATIN CALCIUM 20 MG PO TABS: 20.0000 mg | ORAL_TABLET | Freq: Every day | ORAL | 1 refills | Status: DC

## 2023-03-17 ENCOUNTER — Other Ambulatory Visit: Payer: Self-pay | Admitting: Family

## 2023-03-17 DIAGNOSIS — I10 Essential (primary) hypertension: Secondary | ICD-10-CM

## 2023-08-03 ENCOUNTER — Other Ambulatory Visit

## 2023-08-03 DIAGNOSIS — E559 Vitamin D deficiency, unspecified: Secondary | ICD-10-CM

## 2023-08-03 DIAGNOSIS — Z13220 Encounter for screening for lipoid disorders: Secondary | ICD-10-CM

## 2023-08-03 DIAGNOSIS — Z1329 Encounter for screening for other suspected endocrine disorder: Secondary | ICD-10-CM

## 2023-08-03 DIAGNOSIS — I10 Essential (primary) hypertension: Secondary | ICD-10-CM

## 2023-08-03 DIAGNOSIS — E538 Deficiency of other specified B group vitamins: Secondary | ICD-10-CM

## 2023-08-03 DIAGNOSIS — Z131 Encounter for screening for diabetes mellitus: Secondary | ICD-10-CM

## 2023-08-04 LAB — CMP14+EGFR
ALT: 10 IU/L (ref 0–32)
AST: 14 IU/L (ref 0–40)
Albumin: 3.6 g/dL — ABNORMAL LOW (ref 4.0–5.0)
Alkaline Phosphatase: 44 IU/L (ref 44–121)
BUN/Creatinine Ratio: 15 (ref 9–23)
BUN: 11 mg/dL (ref 6–20)
Bilirubin Total: 0.5 mg/dL (ref 0.0–1.2)
CO2: 19 mmol/L — ABNORMAL LOW (ref 20–29)
Calcium: 8.4 mg/dL — ABNORMAL LOW (ref 8.7–10.2)
Chloride: 109 mmol/L — ABNORMAL HIGH (ref 96–106)
Creatinine, Ser: 0.72 mg/dL (ref 0.57–1.00)
Globulin, Total: 2 g/dL (ref 1.5–4.5)
Glucose: 84 mg/dL (ref 70–99)
Potassium: 4.3 mmol/L (ref 3.5–5.2)
Sodium: 143 mmol/L (ref 134–144)
Total Protein: 5.6 g/dL — ABNORMAL LOW (ref 6.0–8.5)
eGFR: 117 mL/min/{1.73_m2} (ref >59)

## 2023-08-04 LAB — VITAMIN B12: Vitamin B-12: 536 pg/mL (ref 232–1245)

## 2023-08-04 LAB — VITAMIN D 25 HYDROXY (VIT D DEFICIENCY, FRACTURES): Vit D, 25-Hydroxy: 34.1 ng/mL (ref 30.0–100.0)

## 2023-08-10 ENCOUNTER — Ambulatory Visit: Admitting: Family

## 2023-08-10 ENCOUNTER — Encounter: Payer: Self-pay | Admitting: Family

## 2023-08-10 ENCOUNTER — Ambulatory Visit: Payer: Self-pay

## 2023-08-10 VITALS — BP 122/82 | HR 74 | Ht 66.0 in | Wt 273.8 lb

## 2023-08-10 DIAGNOSIS — R7303 Prediabetes: Secondary | ICD-10-CM | POA: Diagnosis not present

## 2023-08-10 DIAGNOSIS — G43709 Chronic migraine without aura, not intractable, without status migrainosus: Secondary | ICD-10-CM

## 2023-08-10 DIAGNOSIS — E782 Mixed hyperlipidemia: Secondary | ICD-10-CM

## 2023-08-10 DIAGNOSIS — Z0130 Encounter for examination of blood pressure without abnormal findings: Secondary | ICD-10-CM

## 2023-08-10 MED ORDER — FAMOTIDINE 40 MG PO TABS: 40.0000 mg | ORAL_TABLET | Freq: Every day | ORAL | 1 refills | Status: DC

## 2023-08-10 MED ORDER — PHENTERMINE HCL 37.5 MG PO TABS: 37.5000 mg | ORAL_TABLET | Freq: Every day | ORAL | 0 refills | Status: DC

## 2023-08-10 MED ORDER — VITAMIN D (ERGOCALCIFEROL) 1.25 MG (50000 UNIT) PO CAPS: 50000.0000 [IU] | ORAL_CAPSULE | ORAL | 3 refills | Status: DC

## 2023-08-10 MED ORDER — NURTEC 75 MG PO TBDP: 75.0000 mg | ORAL_TABLET | ORAL | 0 refills | Status: DC | PRN

## 2023-08-10 MED ORDER — SPIRONOLACTONE 25 MG PO TABS: 25.0000 mg | ORAL_TABLET | Freq: Every day | ORAL | 1 refills | Status: DC

## 2023-08-10 NOTE — Progress Notes (Signed)
 Established Patient Office Visit  Subjective:  Patient ID: Claudia Arnold, female    DOB: 1996-02-08  Age: 28 y.o. MRN: [id number]  Chief Complaint  Patient presents with   Follow-up    6 month follow up, discuss lab results    Patient is here today for her 3 months follow up.  She has been feeling well since last appointment.   She does have additional concerns to discuss today.  Asks if there is something we can do to help with weight loss.  Her insurance does not cover wegovy or zepbound, so she asks if we have other options.   Labs were done previously, so we will discuss these in detail today. She needs refills.   I have reviewed her active problem list, medication list, allergies, notes from last encounter, lab results for her appointment today.      No other concerns at this time.   Past Medical History:  Diagnosis Date   Allergy    Heart murmur    Hyperlipemia    Hypertension     Past Surgical History:  Procedure Laterality Date   CARPAL TUNNEL RELEASE Left 10/07/2022   Procedure: ENDOSCOPIC CARPAL TUNNEL RELEASE;  Surgeon: Beverley Evalene BIRCH, MD;  Location: [facility];  Service: Orthopedics;  Laterality: Left;    Social History   Socioeconomic History   Marital status: Single    Spouse name: Not on file   Number of children: Not on file   Years of education: Not on file   Highest education level: Not on file  Occupational History   Not on file  Tobacco Use   Smoking status: Never   Smokeless tobacco: Never  Vaping Use   Vaping status: Never Used  Substance and Sexual Activity   Alcohol use: No   Drug use: No   Sexual activity: Yes    Birth control/protection: Pill  Other Topics Concern   Not on file  Social History Narrative   Not on file   Social Drivers of Health   Financial Resource Strain: Not on file  Food Insecurity: Not on file  Transportation Needs: Not on file  Physical Activity: Not on file  Stress: Not on  file  Social Connections: Not on file  Intimate Partner Violence: Not on file    Family History  Problem Relation Age of Onset   Hypertension Mother    High Cholesterol Mother     No Known Allergies  Review of Systems  All other systems reviewed and are negative.      Objective:   BP 122/82   Pulse 74   Ht 5' 6 (1.676 m)   Wt 273 lb 12.8 oz (124.2 kg)   SpO2 97%   BMI 44.19 kg/m   Vitals:   08/10/23 1006  BP: 122/82  Pulse: 74  Height: 5' 6 (1.676 m)  Weight: 273 lb 12.8 oz (124.2 kg)  SpO2: 97%  BMI (Calculated): 44.21    Physical Exam Vitals and nursing note reviewed.  Constitutional:      Appearance: Normal appearance. She is obese.  HENT:     Head: Normocephalic.  Eyes:     Extraocular Movements: Extraocular movements intact.     Conjunctiva/sclera: Conjunctivae normal.     Pupils: Pupils are equal, round, and reactive to light.  Cardiovascular:     Rate and Rhythm: Normal rate.  Pulmonary:     Effort: Pulmonary effort is normal.  Neurological:     General:  No focal deficit present.     Mental Status: She is alert and oriented to person, place, and time. Mental status is at baseline.  Psychiatric:        Mood and Affect: Mood normal.        Behavior: Behavior normal.        Thought Content: Thought content normal.        Judgment: Judgment normal.      No results found for any visits on 08/10/23.  Recent Results (from the past 2160 hours)  CMP14+EGFR     Status: Abnormal   Collection Time: 08/03/23  9:54 AM  Result Value Ref Range   Glucose 84 70 - 99 mg/dL   BUN 11 6 - 20 mg/dL   Creatinine, Ser 9.27 0.57 - 1.00 mg/dL   eGFR 882 >40 fO/fpw/8.26   BUN/Creatinine Ratio 15 9 - 23   Sodium 143 134 - 144 mmol/L   Potassium 4.3 3.5 - 5.2 mmol/L   Chloride 109 (H) 96 - 106 mmol/L   CO2 19 (L) 20 - 29 mmol/L   Calcium  8.4 (L) 8.7 - 10.2 mg/dL   Total Protein 5.6 (L) 6.0 - 8.5 g/dL   Albumin 3.6 (L) 4.0 - 5.0 g/dL   Globulin, Total 2.0  1.5 - 4.5 g/dL   Bilirubin Total 0.5 0.0 - 1.2 mg/dL   Alkaline Phosphatase 44 44 - 121 IU/L   AST 14 0 - 40 IU/L   ALT 10 0 - 32 IU/L  Vitamin D  (25 hydroxy)     Status: None   Collection Time: 08/03/23  9:54 AM  Result Value Ref Range   Vit D, 25-Hydroxy 34.1 30.0 - 100.0 ng/mL    Comment: Vitamin D  deficiency has been defined by the Institute of Medicine and an Endocrine Society practice guideline as a level of serum 25-OH vitamin D  less than 20 ng/mL (1,2). The Endocrine Society went on to further define vitamin D  insufficiency as a level between 21 and 29 ng/mL (2). 1. IOM (Institute of Medicine). 2010. Dietary reference    intakes for calcium  and D. [place]: The    [employer]. 2. Holick MF, Binkley [place], Bischoff-Ferrari HA, et al.    Evaluation, treatment, and prevention of vitamin D     deficiency: an Endocrine Society clinical practice    guideline. JCEM. 2011 Jul; 96(7):1911-30.   Vitamin B12     Status: None   Collection Time: 08/03/23  9:54 AM  Result Value Ref Range   Vitamin B-12 536 232 - 1,245 pg/mL       Assessment & Plan:   Problem List Items Addressed This Visit     Chronic migraine without aura without status migrainosus, not intractable   Relevant Orders   CBC with Diff (Completed)   CMP14+EGFR (Completed)   Mixed hyperlipidemia   Relevant Orders   Lipid Profile (Completed)   CBC with Diff (Completed)   CMP14+EGFR (Completed)   Prediabetes - Primary   Relevant Orders   Hemoglobin A1c (Completed)   CBC with Diff (Completed)   CMP14+EGFR (Completed)    Return in about 1 month (around 09/09/2023).   Total time spent: 20 minutes  ALAN CHRISTELLA ARRANT, FNP  08/10/2023   This document may have been prepared by [facility] Voice Recognition software and as such may include unintentional dictation errors.

## 2023-08-11 LAB — LIPID PANEL
Chol/HDL Ratio: 2.5 ratio (ref 0.0–4.4)
Cholesterol, Total: 145 mg/dL (ref 100–199)
HDL: 57 mg/dL (ref >39)
LDL Chol Calc (NIH): 74 mg/dL (ref 0–99)
Triglycerides: 70 mg/dL (ref 0–149)
VLDL Cholesterol Cal: 14 mg/dL (ref 5–40)

## 2023-08-11 LAB — CMP14+EGFR
ALT: 13 IU/L (ref 0–32)
AST: 11 IU/L (ref 0–40)
Albumin: 3.8 g/dL — ABNORMAL LOW (ref 4.0–5.0)
Alkaline Phosphatase: 43 IU/L — ABNORMAL LOW (ref 44–121)
BUN/Creatinine Ratio: 14 (ref 9–23)
BUN: 11 mg/dL (ref 6–20)
Bilirubin Total: 0.4 mg/dL (ref 0.0–1.2)
CO2: 20 mmol/L (ref 20–29)
Calcium: 9 mg/dL (ref 8.7–10.2)
Chloride: 108 mmol/L — ABNORMAL HIGH (ref 96–106)
Creatinine, Ser: 0.81 mg/dL (ref 0.57–1.00)
Globulin, Total: 2 g/dL (ref 1.5–4.5)
Glucose: 86 mg/dL (ref 70–99)
Potassium: 4.3 mmol/L (ref 3.5–5.2)
Sodium: 141 mmol/L (ref 134–144)
Total Protein: 5.8 g/dL — ABNORMAL LOW (ref 6.0–8.5)
eGFR: 101 mL/min/{1.73_m2} (ref >59)

## 2023-08-11 LAB — CBC WITH DIFFERENTIAL/PLATELET
Basophils Absolute: 0.1 10*3/uL (ref 0.0–0.2)
Basos: 1 %
EOS (ABSOLUTE): 0.3 10*3/uL (ref 0.0–0.4)
Eos: 4 %
Hematocrit: 39.5 % (ref 34.0–46.6)
Hemoglobin: 12.5 g/dL (ref 11.1–15.9)
Immature Grans (Abs): 0 10*3/uL (ref 0.0–0.1)
Immature Granulocytes: 0 %
Lymphocytes Absolute: 2.7 10*3/uL (ref 0.7–3.1)
Lymphs: 39 %
MCH: 30.5 pg (ref 26.6–33.0)
MCHC: 31.6 g/dL (ref 31.5–35.7)
MCV: 96 fL (ref 79–97)
Monocytes Absolute: 0.4 10*3/uL (ref 0.1–0.9)
Monocytes: 6 %
Neutrophils Absolute: 3.4 10*3/uL (ref 1.4–7.0)
Neutrophils: 50 %
Platelets: 277 10*3/uL (ref 150–450)
RBC: 4.1 x10E6/uL (ref 3.77–5.28)
RDW: 12.3 % (ref 11.7–15.4)
WBC: 6.9 10*3/uL (ref 3.4–10.8)

## 2023-08-11 LAB — HEMOGLOBIN A1C
Est. average glucose Bld gHb Est-mCnc: 108 mg/dL
Hgb A1c MFr Bld: 5.4 % (ref 4.8–5.6)

## 2023-08-13 ENCOUNTER — Ambulatory Visit: Payer: Self-pay

## 2023-09-03 ENCOUNTER — Other Ambulatory Visit: Payer: Self-pay | Admitting: Family

## 2023-09-11 ENCOUNTER — Ambulatory Visit (INDEPENDENT_AMBULATORY_CARE_PROVIDER_SITE_OTHER): Admitting: Family

## 2023-09-11 ENCOUNTER — Encounter: Payer: Self-pay | Admitting: Family

## 2023-09-11 VITALS — BP 112/78 | HR 117 | Ht 66.0 in | Wt 265.6 lb

## 2023-09-11 DIAGNOSIS — E559 Vitamin D deficiency, unspecified: Secondary | ICD-10-CM | POA: Diagnosis not present

## 2023-09-11 DIAGNOSIS — E66813 Obesity, class 3: Secondary | ICD-10-CM

## 2023-09-11 DIAGNOSIS — I10 Essential (primary) hypertension: Secondary | ICD-10-CM | POA: Insufficient documentation

## 2023-09-11 DIAGNOSIS — R7303 Prediabetes: Secondary | ICD-10-CM

## 2023-09-11 DIAGNOSIS — Z6841 Body Mass Index (BMI) 40.0 and over, adult: Secondary | ICD-10-CM

## 2023-09-11 DIAGNOSIS — E782 Mixed hyperlipidemia: Secondary | ICD-10-CM | POA: Diagnosis not present

## 2023-09-11 MED ORDER — SPIRONOLACTONE 25 MG PO TABS: 25.0000 mg | ORAL_TABLET | Freq: Every day | ORAL | 1 refills | Status: DC

## 2023-09-11 MED ORDER — CHLORTHALIDONE 25 MG PO TABS: 25.0000 mg | ORAL_TABLET | Freq: Every day | ORAL | 3 refills | Status: AC

## 2023-09-11 MED ORDER — AMLODIPINE BESYLATE 5 MG PO TABS: 5.0000 mg | ORAL_TABLET | Freq: Every day | ORAL | 3 refills | Status: DC

## 2023-09-11 MED ORDER — NORGESTIMATE-ETH ESTRADIOL 0.25-35 MG-MCG PO TABS: 1.0000 | ORAL_TABLET | Freq: Every day | ORAL | 3 refills | Status: DC

## 2023-09-11 MED ORDER — PHENTERMINE HCL 37.5 MG PO TABS: 37.5000 mg | ORAL_TABLET | Freq: Every day | ORAL | 1 refills | Status: DC

## 2023-09-11 MED ORDER — ROSUVASTATIN CALCIUM 20 MG PO TABS: 20.0000 mg | ORAL_TABLET | Freq: Every day | ORAL | 1 refills | Status: DC

## 2023-09-11 MED ORDER — LOSARTAN POTASSIUM 100 MG PO TABS: 100.0000 mg | ORAL_TABLET | Freq: Every day | ORAL | 3 refills | Status: DC

## 2023-09-11 MED ORDER — FAMOTIDINE 40 MG PO TABS: 40.0000 mg | ORAL_TABLET | Freq: Every day | ORAL | 1 refills | Status: AC

## 2023-09-11 MED ORDER — NURTEC 75 MG PO TBDP: 75.0000 mg | ORAL_TABLET | ORAL | 3 refills | Status: DC | PRN

## 2023-09-11 NOTE — Progress Notes (Signed)
 Established Patient Office Visit  Subjective:  Patient ID: Claudia Arnold, female    DOB: Jun 09, 1995  Age: 28 y.o. MRN: [id number]  Chief Complaint  Patient presents with   Follow-up    1 month follow up    Patient is here today for her 1 month follow up.  She has been feeling fairly well since last appointment.   She does not have additional concerns to discuss today.  Labs are not due today.  She needs refills.   I have reviewed her active problem list, medication list, allergies, notes from last encounter, lab results for her appointment today.      No other concerns at this time.   Past Medical History:  Diagnosis Date   Allergy    Heart murmur    Hyperlipemia    Hypertension     Past Surgical History:  Procedure Laterality Date   CARPAL TUNNEL RELEASE Left 10/07/2022   Procedure: ENDOSCOPIC CARPAL TUNNEL RELEASE;  Surgeon: Beverley Evalene BIRCH, MD;  Location: [facility];  Service: Orthopedics;  Laterality: Left;    Social History   Socioeconomic History   Marital status: Single    Spouse name: Not on file   Number of children: Not on file   Years of education: Not on file   Highest education level: Not on file  Occupational History   Not on file  Tobacco Use   Smoking status: Never   Smokeless tobacco: Never  Vaping Use   Vaping status: Never Used  Substance and Sexual Activity   Alcohol use: No   Drug use: No   Sexual activity: Yes    Birth control/protection: Pill  Other Topics Concern   Not on file  Social History Narrative   Not on file   Social Drivers of Health   Financial Resource Strain: Not on file  Food Insecurity: Not on file  Transportation Needs: Not on file  Physical Activity: Not on file  Stress: Not on file  Social Connections: Not on file  Intimate Partner Violence: Not on file    Family History  Problem Relation Age of Onset   Hypertension Mother    High Cholesterol Mother     No Known  Allergies  Review of Systems  All other systems reviewed and are negative.      Objective:   BP 112/78   Pulse (!) 117   Ht 5' 6 (1.676 m)   Wt 265 lb 9.6 oz (120.5 kg)   SpO2 96%   BMI 42.87 kg/m   Vitals:   09/11/23 0900  BP: 112/78  Pulse: (!) 117  Height: 5' 6 (1.676 m)  Weight: 265 lb 9.6 oz (120.5 kg)  SpO2: 96%  BMI (Calculated): 42.89    Physical Exam Vitals and nursing note reviewed.  Constitutional:      Appearance: Normal appearance. She is normal weight.  HENT:     Head: Normocephalic.  Eyes:     Extraocular Movements: Extraocular movements intact.     Conjunctiva/sclera: Conjunctivae normal.     Pupils: Pupils are equal, round, and reactive to light.  Cardiovascular:     Rate and Rhythm: Normal rate.  Pulmonary:     Effort: Pulmonary effort is normal.  Neurological:     General: No focal deficit present.     Mental Status: She is alert and oriented to person, place, and time. Mental status is at baseline.  Psychiatric:        Mood and  Affect: Mood normal.        Behavior: Behavior normal.        Thought Content: Thought content normal.      No results found for any visits on 09/11/23.  Recent Results (from the past 2160 hours)  CMP14+EGFR     Status: Abnormal   Collection Time: 08/03/23  9:54 AM  Result Value Ref Range   Glucose 84 70 - 99 mg/dL   BUN 11 6 - 20 mg/dL   Creatinine, Ser 9.27 0.57 - 1.00 mg/dL   eGFR 882 >40 fO/fpw/8.26   BUN/Creatinine Ratio 15 9 - 23   Sodium 143 134 - 144 mmol/L   Potassium 4.3 3.5 - 5.2 mmol/L   Chloride 109 (H) 96 - 106 mmol/L   CO2 19 (L) 20 - 29 mmol/L   Calcium  8.4 (L) 8.7 - 10.2 mg/dL   Total Protein 5.6 (L) 6.0 - 8.5 g/dL   Albumin 3.6 (L) 4.0 - 5.0 g/dL   Globulin, Total 2.0 1.5 - 4.5 g/dL   Bilirubin Total 0.5 0.0 - 1.2 mg/dL   Alkaline Phosphatase 44 44 - 121 IU/L   AST 14 0 - 40 IU/L   ALT 10 0 - 32 IU/L  Vitamin D  (25 hydroxy)     Status: None   Collection Time: 08/03/23  9:54 AM   Result Value Ref Range   Vit D, 25-Hydroxy 34.1 30.0 - 100.0 ng/mL    Comment: Vitamin D  deficiency has been defined by the Institute of Medicine and an Endocrine Society practice guideline as a level of serum 25-OH vitamin D  less than 20 ng/mL (1,2). The Endocrine Society went on to further define vitamin D  insufficiency as a level between 21 and 29 ng/mL (2). 1. IOM (Institute of Medicine). 2010. Dietary reference    intakes for calcium  and D. [place]: The    [employer]. 2. Holick MF, Binkley [place], Bischoff-Ferrari HA, et al.    Evaluation, treatment, and prevention of vitamin D     deficiency: an Endocrine Society clinical practice    guideline. JCEM. 2011 Jul; 96(7):1911-30.   Vitamin B12     Status: None   Collection Time: 08/03/23  9:54 AM  Result Value Ref Range   Vitamin B-12 536 232 - 1,245 pg/mL  Hemoglobin A1c     Status: None   Collection Time: 08/10/23 11:25 AM  Result Value Ref Range   Hgb A1c MFr Bld 5.4 4.8 - 5.6 %    Comment:          Prediabetes: 5.7 - 6.4          Diabetes: >6.4          Glycemic control for adults with diabetes: <7.0    Est. average glucose Bld gHb Est-mCnc 108 mg/dL  Lipid Profile     Status: None   Collection Time: 08/10/23 11:25 AM  Result Value Ref Range   Cholesterol, Total 145 100 - 199 mg/dL   Triglycerides 70 0 - 149 mg/dL   HDL 57 >60 mg/dL   VLDL Cholesterol Cal 14 5 - 40 mg/dL   LDL Chol Calc (NIH) 74 0 - 99 mg/dL   Chol/HDL Ratio 2.5 0.0 - 4.4 ratio    Comment:                                   T. Chol/HDL Ratio  Men  Women                               1/2 Avg.Risk  3.4    3.3                                   Avg.Risk  5.0    4.4                                2X Avg.Risk  9.6    7.1                                3X Avg.Risk 23.4   11.0   CBC with Diff     Status: None   Collection Time: 08/10/23 11:25 AM  Result Value Ref Range   WBC 6.9 3.4 - 10.8 x10E3/uL    RBC 4.10 3.77 - 5.28 x10E6/uL   Hemoglobin 12.5 11.1 - 15.9 g/dL   Hematocrit 60.4 65.9 - 46.6 %   MCV 96 79 - 97 fL   MCH 30.5 26.6 - 33.0 pg   MCHC 31.6 31.5 - 35.7 g/dL   RDW 87.6 88.2 - 84.5 %   Platelets 277 150 - 450 x10E3/uL   Neutrophils 50 Not Estab. %   Lymphs 39 Not Estab. %   Monocytes 6 Not Estab. %   Eos 4 Not Estab. %   Basos 1 Not Estab. %   Neutrophils Absolute 3.4 1.4 - 7.0 x10E3/uL   Lymphocytes Absolute 2.7 0.7 - 3.1 x10E3/uL   Monocytes Absolute 0.4 0.1 - 0.9 x10E3/uL   EOS (ABSOLUTE) 0.3 0.0 - 0.4 x10E3/uL   Basophils Absolute 0.1 0.0 - 0.2 x10E3/uL   Immature Granulocytes 0 Not Estab. %   Immature Grans (Abs) 0.0 0.0 - 0.1 x10E3/uL  CMP14+EGFR     Status: Abnormal   Collection Time: 08/10/23 11:25 AM  Result Value Ref Range   Glucose 86 70 - 99 mg/dL   BUN 11 6 - 20 mg/dL   Creatinine, Ser 9.18 0.57 - 1.00 mg/dL   eGFR 898 >40 fO/fpw/8.26   BUN/Creatinine Ratio 14 9 - 23   Sodium 141 134 - 144 mmol/L   Potassium 4.3 3.5 - 5.2 mmol/L   Chloride 108 (H) 96 - 106 mmol/L   CO2 20 20 - 29 mmol/L   Calcium  9.0 8.7 - 10.2 mg/dL   Total Protein 5.8 (L) 6.0 - 8.5 g/dL   Albumin 3.8 (L) 4.0 - 5.0 g/dL   Globulin, Total 2.0 1.5 - 4.5 g/dL   Bilirubin Total 0.4 0.0 - 1.2 mg/dL   Alkaline Phosphatase 43 (L) 44 - 121 IU/L   AST 11 0 - 40 IU/L   ALT 13 0 - 32 IU/L       Assessment & Plan Mixed hyperlipidemia Continue current therapy for lipid control. Will modify as needed based on labwork results.   Prediabetes A1C Continues to be in prediabetic ranges.  Will reassess at follow up after next lab check.  Patient counseled on dietary choices and verbalized understanding.   Essential hypertension, benign Blood pressure well controlled with current medications.  Continue current therapy.  Will reassess at follow up.   Vitamin D  deficiency, unspecified Patient stable.  Well controlled with current therapy.   Continue current meds.   Class 3 severe  obesity due to excess calories with serious comorbidity and body mass index (BMI) of 40.0 to 44.9 in adult The patient has med the threshold to continue phentermine  therapy.  she has been reminded of the weight loss requirements to continue therapy.  she verbalized understanding and agreement with plan of care.      Return in about 2 months (around 11/12/2023).   Total time spent: 20 minutes  ALAN CHRISTELLA ARRANT, FNP  09/11/2023   This document may have been prepared by [facility] Voice Recognition software and as such may include unintentional dictation errors.

## 2023-09-12 ENCOUNTER — Encounter: Payer: Self-pay | Admitting: Family

## 2023-09-12 NOTE — Assessment & Plan Note (Signed)
 Patient stable.  Well controlled with current therapy.   Continue current meds.

## 2023-09-12 NOTE — Assessment & Plan Note (Signed)
 A1C Continues to be in prediabetic ranges.  Will reassess at follow up after next lab check.  Patient counseled on dietary choices and verbalized understanding.

## 2023-09-12 NOTE — Assessment & Plan Note (Signed)
 Continue current therapy for lipid control. Will modify as needed based on labwork results.

## 2023-09-12 NOTE — Assessment & Plan Note (Signed)
 Blood pressure well controlled with current medications.  Continue current therapy.  Will reassess at follow up.

## 2023-09-12 NOTE — Assessment & Plan Note (Signed)
The patient has med the threshold to continue phentermine therapy.  she has been reminded of the weight loss requirements to continue therapy.  she verbalized understanding and agreement with plan of care.   

## 2023-10-19 ENCOUNTER — Ambulatory Visit
Admission: EM | Admit: 2023-10-19 | Discharge: 2023-10-19 | Disposition: A | Discharge status: Home / Self Care | Attending: Family Medicine | Admitting: Family Medicine

## 2023-10-19 DIAGNOSIS — N3000 Acute cystitis without hematuria: Secondary | ICD-10-CM | POA: Insufficient documentation

## 2023-10-19 DIAGNOSIS — R300 Dysuria: Secondary | ICD-10-CM | POA: Diagnosis present

## 2023-10-19 LAB — POCT URINE DIPSTICK
Bilirubin, UA: NEGATIVE
Blood, UA: NEGATIVE
Glucose, UA: NEGATIVE mg/dL
Ketones, POC UA: NEGATIVE mg/dL
Leukocytes, UA: NEGATIVE
Nitrite, UA: NEGATIVE
POC PROTEIN,UA: 100 — AB
Spec Grav, UA: >=1.03 — AB (ref 1.010–1.025)
Urobilinogen, UA: 1 U/dL
pH, UA: 5.5 (ref 5.0–8.0)

## 2023-10-19 LAB — POCT URINE PREGNANCY: Preg Test, Ur: NEGATIVE

## 2023-10-19 MED ORDER — CEPHALEXIN 500 MG PO CAPS: 500.0000 mg | ORAL_CAPSULE | Freq: Two times a day (BID) | ORAL | 0 refills | Status: DC

## 2023-10-19 NOTE — ED Triage Notes (Signed)
 Pt c/o urinary freq, dysuria started 2 days ago-NAD-steady gait

## 2023-10-19 NOTE — ED Provider Notes (Signed)
 [facility]  Note:  This document was prepared using Conservation officer, historic buildings and may include unintentional dictation errors.  MRN: [id number] DOB: [birth year]  Subjective:   Claudia Arnold is a 28 y.o. female presenting for 2 day history of dysuria, urinary frequency, urinary urgency. Denies fever, n/v, abdominal pain, pelvic pain, rashes, hematuria, vaginal discharge.  No concern for UTI. Has not hydrated well this past week.   No current facility-administered medications for this encounter.  Current Outpatient Medications:    acetaminophen  (TYLENOL ) 500 MG tablet, Take 2 tablets (1,000 mg total) by mouth every 8 (eight) hours as needed for mild pain or moderate pain., Disp: 60 tablet, Rfl: 0   amLODipine  (NORVASC ) 5 MG tablet, Take 1 tablet (5 mg total) by mouth daily., Disp: 90 tablet, Rfl: 3   BIOTIN BEAUTY EXTRA STRENGTH PO, Take by mouth., Disp: , Rfl:    chlorthalidone  (HYGROTON ) 25 MG tablet, Take 1 tablet (25 mg total) by mouth daily., Disp: 90 tablet, Rfl: 3   Cholecalciferol (D3) 50 MCG (2000 UT) CHEW, Chew by mouth., Disp: , Rfl:    famotidine  (PEPCID ) 40 MG tablet, Take 1 tablet (40 mg total) by mouth daily., Disp: 90 tablet, Rfl: 1   losartan  (COZAAR ) 100 MG tablet, Take 1 tablet (100 mg total) by mouth daily., Disp: 90 tablet, Rfl: 3   Magnesium 400 MG CAPS, Take by mouth., Disp: , Rfl:    norgestimate -ethinyl estradiol  (ESTARYLLA) 0.25-35 MG-MCG tablet, Take 1 tablet by mouth daily., Disp: 84 tablet, Rfl: 3   phentermine  (ADIPEX-P ) 37.5 MG tablet, Take 1 tablet (37.5 mg total) by mouth daily before breakfast., Disp: 30 tablet, Rfl: 1   Rimegepant Sulfate (NURTEC) 75 MG TBDP, Take 1 tablet (75 mg total) by mouth as needed (for migraine.  Max 1 tablet per day.)., Disp: 16 tablet, Rfl: 3   rosuvastatin  (CRESTOR ) 20 MG tablet, Take 1 tablet (20 mg total) by mouth daily., Disp: 90 tablet, Rfl: 1   spironolactone  (ALDACTONE ) 25 MG tablet, Take 1  tablet (25 mg total) by mouth daily., Disp: 90 tablet, Rfl: 1   vitamin B-12 (CYANOCOBALAMIN) 100 MCG tablet, Take 100 mcg by mouth daily., Disp: , Rfl:    vitamin C (ASCORBIC ACID) 250 MG tablet, Take 250 mg by mouth daily., Disp: , Rfl:    Vitamin D , Ergocalciferol , (DRISDOL ) 1.25 MG (50000 UNIT) CAPS capsule, Take 1 capsule (50,000 Units total) by mouth every 7 (seven) days., Disp: 12 capsule, Rfl: 3   No Known Allergies  Past Medical History:  Diagnosis Date   Allergy    Heart murmur    Hyperlipemia    Hypertension      Past Surgical History:  Procedure Laterality Date   CARPAL TUNNEL RELEASE Left 10/07/2022   Procedure: ENDOSCOPIC CARPAL TUNNEL RELEASE;  Surgeon: Beverley Evalene BIRCH, MD;  Location: [facility];  Service: Orthopedics;  Laterality: Left;    Family History  Problem Relation Age of Onset   Hypertension Mother    High Cholesterol Mother     Social History   Tobacco Use   Smoking status: Never   Smokeless tobacco: Never  Vaping Use   Vaping status: Never Used  Substance Use Topics   Alcohol use: Yes    Comment: occ   Drug use: No    ROS   Objective:   Vitals: BP 119/72 (BP Location: Right Arm)   Pulse 78   Temp 98.9 F (37.2 C) (Oral)  Resp 20   LMP 10/06/2023   SpO2 96%   Physical Exam Constitutional:      General: She is not in acute distress.    Appearance: Normal appearance. She is well-developed. She is not ill-appearing, toxic-appearing or diaphoretic.  HENT:     Head: Normocephalic and atraumatic.     Nose: Nose normal.     Mouth/Throat:     Mouth: Mucous membranes are moist.  Eyes:     General: No scleral icterus.       Right eye: No discharge.        Left eye: No discharge.     Extraocular Movements: Extraocular movements intact.     Conjunctiva/sclera: Conjunctivae normal.  Cardiovascular:     Rate and Rhythm: Normal rate.  Pulmonary:     Effort: Pulmonary effort is normal.  Abdominal:     General: Bowel  sounds are normal. There is no distension.     Palpations: Abdomen is soft. There is no mass.     Tenderness: There is no abdominal tenderness. There is no right CVA tenderness, left CVA tenderness, guarding or rebound.  Skin:    General: Skin is warm and dry.  Neurological:     General: No focal deficit present.     Mental Status: She is alert and oriented to person, place, and time.  Psychiatric:        Mood and Affect: Mood normal.        Behavior: Behavior normal.        Thought Content: Thought content normal.        Judgment: Judgment normal.     Results for orders placed or performed during the hospital encounter of 10/19/23 (from the past 24 hours)  POCT URINE DIPSTICK     Status: Abnormal   Collection Time: 10/19/23  8:38 AM  Result Value Ref Range   Color, UA yellow yellow   Clarity, UA hazy (A) clear   Glucose, UA negative negative mg/dL   Bilirubin, UA negative negative   Ketones, POC UA negative negative mg/dL   Spec Grav, UA >=8.969 (A) 1.010 - 1.025   Blood, UA negative negative   pH, UA 5.5 5.0 - 8.0   POC PROTEIN,UA =100 (A) negative, trace   Urobilinogen, UA 1.0 0.2 or 1.0 E.U./dL   Nitrite, UA Negative Negative   Leukocytes, UA Negative Negative  POCT urine pregnancy     Status: None   Collection Time: 10/19/23  8:38 AM  Result Value Ref Range   Preg Test, Ur Negative Negative    Assessment and Plan :   PDMP not reviewed this encounter.  1. Acute cystitis without hematuria   2. Dysuria      Start cephalexin  to cover for acute cystitis, urine culture pending.  Recommended aggressive hydration, limiting urinary irritants. Counseled patient on potential for adverse effects with medications prescribed/recommended today, ER and return-to-clinic precautions discussed, patient verbalized understanding.    Christopher Savannah, [place] 10/19/23 [phone]

## 2023-10-19 NOTE — Discharge Instructions (Addendum)
Please start cephalexin to address an urinary tract infection. Make sure you hydrate very well with plain water and a quantity of 64 ounces of water a day.  Please limit drinks that are considered urinary irritants such as soda, sweet tea, coffee, energy drinks, alcohol.  These can worsen your urinary and genital symptoms but also be the source of them.  I will let you know about your urine culture results through MyChart to see if we need to prescribe or change your antibiotics based off of those results.

## 2023-10-22 ENCOUNTER — Ambulatory Visit (HOSPITAL_COMMUNITY): Payer: Self-pay

## 2023-10-22 LAB — URINE CULTURE: Culture: 50000 — AB

## 2023-10-30 ENCOUNTER — Ambulatory Visit (INDEPENDENT_AMBULATORY_CARE_PROVIDER_SITE_OTHER): Admitting: Family

## 2023-10-30 VITALS — BP 128/88 | HR 103 | Ht 66.0 in | Wt 261.4 lb

## 2023-10-30 DIAGNOSIS — E66813 Obesity, class 3: Secondary | ICD-10-CM

## 2023-10-30 DIAGNOSIS — Z6841 Body Mass Index (BMI) 40.0 and over, adult: Secondary | ICD-10-CM

## 2023-10-30 DIAGNOSIS — Z0130 Encounter for examination of blood pressure without abnormal findings: Secondary | ICD-10-CM

## 2023-10-30 DIAGNOSIS — L509 Urticaria, unspecified: Secondary | ICD-10-CM

## 2023-10-30 DIAGNOSIS — N390 Urinary tract infection, site not specified: Secondary | ICD-10-CM

## 2023-10-30 LAB — POCT URINALYSIS DIPSTICK
Bilirubin, UA: NEGATIVE
Blood, UA: NEGATIVE
Glucose, UA: NEGATIVE
Ketones, UA: NEGATIVE
Leukocytes, UA: NEGATIVE
Nitrite, UA: NEGATIVE
Protein, UA: NEGATIVE
Spec Grav, UA: 1.02 (ref 1.010–1.025)
Urobilinogen, UA: 0.2 U/dL
pH, UA: 7 (ref 5.0–8.0)

## 2023-10-30 MED ORDER — VITAMIN D (ERGOCALCIFEROL) 1.25 MG (50000 UNIT) PO CAPS: 50000.0000 [IU] | ORAL_CAPSULE | ORAL | 3 refills | Status: DC

## 2023-10-30 MED ORDER — MONTELUKAST SODIUM 10 MG PO TABS: 10.0000 mg | ORAL_TABLET | Freq: Every day | ORAL | 2 refills | Status: DC

## 2023-10-30 NOTE — Progress Notes (Signed)
 Established Patient Office Visit  Subjective:  Patient ID: Claudia Arnold, female    DOB: 1995/03/22  Age: 28 y.o. MRN: [id number]  Chief Complaint  Patient presents with   Follow-up    7 week follow up. Having some itching and breaking out in hives has been occurring randomly.     Patient is here today for her 1 months follow up.  She has been feeling fairly well since last appointment.   She does have additional concerns to discuss today.  She has been having some issues where she will break out in hives at random intervals.  The hives aren't always all in the same areas, sometimes they are widespread, sometimes, just on her face or arms.  She says that she noticed them particularly after she got very warm one day while out with her family.   Labs are not due today.  She needs refills.   I have reviewed her active problem list, medication list, allergies, notes from last encounter, lab results for her appointment today.      No other concerns at this time.   Past Medical History:  Diagnosis Date   Allergy    Heart murmur    Hyperlipemia    Hypertension     Past Surgical History:  Procedure Laterality Date   CARPAL TUNNEL RELEASE Left 10/07/2022   Procedure: ENDOSCOPIC CARPAL TUNNEL RELEASE;  Surgeon: Beverley Evalene BIRCH, MD;  Location: [facility];  Service: Orthopedics;  Laterality: Left;    Social History   Socioeconomic History   Marital status: Single    Spouse name: Not on file   Number of children: Not on file   Years of education: Not on file   Highest education level: Not on file  Occupational History   Not on file  Tobacco Use   Smoking status: Never   Smokeless tobacco: Never  Vaping Use   Vaping status: Never Used  Substance and Sexual Activity   Alcohol use: Yes    Comment: occ   Drug use: No   Sexual activity: Yes    Birth control/protection: Pill  Other Topics Concern   Not on file  Social History Narrative   Not on file    Social Drivers of Health   Financial Resource Strain: Not on file  Food Insecurity: Not on file  Transportation Needs: Not on file  Physical Activity: Not on file  Stress: Not on file  Social Connections: Not on file  Intimate Partner Violence: Not on file    Family History  Problem Relation Age of Onset   Hypertension Mother    High Cholesterol Mother     No Known Allergies  Review of Systems  All other systems reviewed and are negative.      Objective:   BP 128/88   Pulse (!) 103   Ht 5' 6 (1.676 m)   Wt 261 lb 6.4 oz (118.6 kg)   LMP 10/06/2023   SpO2 95%   BMI 42.19 kg/m   Vitals:   10/30/23 0950  BP: 128/88  Pulse: (!) 103  Height: 5' 6 (1.676 m)  Weight: 261 lb 6.4 oz (118.6 kg)  SpO2: 95%  BMI (Calculated): 42.21    Physical Exam Vitals and nursing note reviewed.  Constitutional:      Appearance: Normal appearance. She is normal weight.  HENT:     Head: Normocephalic.  Eyes:     Extraocular Movements: Extraocular movements intact.     Conjunctiva/sclera:  Conjunctivae normal.     Pupils: Pupils are equal, round, and reactive to light.  Cardiovascular:     Rate and Rhythm: Normal rate.  Pulmonary:     Effort: Pulmonary effort is normal.  Neurological:     General: No focal deficit present.     Mental Status: She is alert and oriented to person, place, and time. Mental status is at baseline.  Psychiatric:        Mood and Affect: Mood normal.        Behavior: Behavior normal.        Thought Content: Thought content normal.      No results found for any visits on 10/30/23.  Recent Results (from the past 2160 hours)  CMP14+EGFR     Status: Abnormal   Collection Time: 08/03/23  9:54 AM  Result Value Ref Range   Glucose 84 70 - 99 mg/dL   BUN 11 6 - 20 mg/dL   Creatinine, Ser 9.27 0.57 - 1.00 mg/dL   eGFR 882 >40 fO/fpw/8.26   BUN/Creatinine Ratio 15 9 - 23   Sodium 143 134 - 144 mmol/L   Potassium 4.3 3.5 - 5.2 mmol/L    Chloride 109 (H) 96 - 106 mmol/L   CO2 19 (L) 20 - 29 mmol/L   Calcium  8.4 (L) 8.7 - 10.2 mg/dL   Total Protein 5.6 (L) 6.0 - 8.5 g/dL   Albumin 3.6 (L) 4.0 - 5.0 g/dL   Globulin, Total 2.0 1.5 - 4.5 g/dL   Bilirubin Total 0.5 0.0 - 1.2 mg/dL   Alkaline Phosphatase 44 44 - 121 IU/L   AST 14 0 - 40 IU/L   ALT 10 0 - 32 IU/L  Vitamin D  (25 hydroxy)     Status: None   Collection Time: 08/03/23  9:54 AM  Result Value Ref Range   Vit D, 25-Hydroxy 34.1 30.0 - 100.0 ng/mL    Comment: Vitamin D  deficiency has been defined by the Institute of Medicine and an Endocrine Society practice guideline as a level of serum 25-OH vitamin D  less than 20 ng/mL (1,2). The Endocrine Society went on to further define vitamin D  insufficiency as a level between 21 and 29 ng/mL (2). 1. IOM (Institute of Medicine). 2010. Dietary reference    intakes for calcium  and D. [place]: The    [employer]. 2. Holick MF, Binkley [place], Bischoff-Ferrari HA, et al.    Evaluation, treatment, and prevention of vitamin D     deficiency: an Endocrine Society clinical practice    guideline. JCEM. 2011 Jul; 96(7):1911-30.   Vitamin B12     Status: None   Collection Time: 08/03/23  9:54 AM  Result Value Ref Range   Vitamin B-12 536 232 - 1,245 pg/mL  Hemoglobin A1c     Status: None   Collection Time: 08/10/23 11:25 AM  Result Value Ref Range   Hgb A1c MFr Bld 5.4 4.8 - 5.6 %    Comment:          Prediabetes: 5.7 - 6.4          Diabetes: >6.4          Glycemic control for adults with diabetes: <7.0    Est. average glucose Bld gHb Est-mCnc 108 mg/dL  Lipid Profile     Status: None   Collection Time: 08/10/23 11:25 AM  Result Value Ref Range   Cholesterol, Total 145 100 - 199 mg/dL   Triglycerides 70 0 - 149 mg/dL  HDL 57 >39 mg/dL   VLDL Cholesterol Cal 14 5 - 40 mg/dL   LDL Chol Calc (NIH) 74 0 - 99 mg/dL   Chol/HDL Ratio 2.5 0.0 - 4.4 ratio    Comment:                                   T. Chol/HDL  Ratio                                             Men  Women                               1/2 Avg.Risk  3.4    3.3                                   Avg.Risk  5.0    4.4                                2X Avg.Risk  9.6    7.1                                3X Avg.Risk 23.4   11.0   CBC with Diff     Status: None   Collection Time: 08/10/23 11:25 AM  Result Value Ref Range   WBC 6.9 3.4 - 10.8 x10E3/uL   RBC 4.10 3.77 - 5.28 x10E6/uL   Hemoglobin 12.5 11.1 - 15.9 g/dL   Hematocrit 60.4 65.9 - 46.6 %   MCV 96 79 - 97 fL   MCH 30.5 26.6 - 33.0 pg   MCHC 31.6 31.5 - 35.7 g/dL   RDW 87.6 88.2 - 84.5 %   Platelets 277 150 - 450 x10E3/uL   Neutrophils 50 Not Estab. %   Lymphs 39 Not Estab. %   Monocytes 6 Not Estab. %   Eos 4 Not Estab. %   Basos 1 Not Estab. %   Neutrophils Absolute 3.4 1.4 - 7.0 x10E3/uL   Lymphocytes Absolute 2.7 0.7 - 3.1 x10E3/uL   Monocytes Absolute 0.4 0.1 - 0.9 x10E3/uL   EOS (ABSOLUTE) 0.3 0.0 - 0.4 x10E3/uL   Basophils Absolute 0.1 0.0 - 0.2 x10E3/uL   Immature Granulocytes 0 Not Estab. %   Immature Grans (Abs) 0.0 0.0 - 0.1 x10E3/uL  CMP14+EGFR     Status: Abnormal   Collection Time: 08/10/23 11:25 AM  Result Value Ref Range   Glucose 86 70 - 99 mg/dL   BUN 11 6 - 20 mg/dL   Creatinine, Ser 9.18 0.57 - 1.00 mg/dL   eGFR 898 >40 fO/fpw/8.26   BUN/Creatinine Ratio 14 9 - 23   Sodium 141 134 - 144 mmol/L   Potassium 4.3 3.5 - 5.2 mmol/L   Chloride 108 (H) 96 - 106 mmol/L   CO2 20 20 - 29 mmol/L   Calcium  9.0 8.7 - 10.2 mg/dL   Total Protein 5.8 (L) 6.0 - 8.5 g/dL   Albumin 3.8 (L) 4.0 - 5.0 g/dL   Globulin, Total 2.0 1.5 - 4.5 g/dL  Bilirubin Total 0.4 0.0 - 1.2 mg/dL   Alkaline Phosphatase 43 (L) 44 - 121 IU/L   AST 11 0 - 40 IU/L   ALT 13 0 - 32 IU/L  POCT URINE DIPSTICK     Status: Abnormal   Collection Time: 10/19/23  8:38 AM  Result Value Ref Range   Color, UA yellow yellow   Clarity, UA hazy (A) clear   Glucose, UA negative negative  mg/dL   Bilirubin, UA negative negative   Ketones, POC UA negative negative mg/dL   Spec Grav, UA >=8.969 (A) 1.010 - 1.025   Blood, UA negative negative   pH, UA 5.5 5.0 - 8.0   POC PROTEIN,UA =100 (A) negative, trace   Urobilinogen, UA 1.0 0.2 or 1.0 E.U./dL   Nitrite, UA Negative Negative   Leukocytes, UA Negative Negative  POCT urine pregnancy     Status: None   Collection Time: 10/19/23  8:38 AM  Result Value Ref Range   Preg Test, Ur Negative Negative  Urine Culture     Status: Abnormal   Collection Time: 10/19/23  8:39 AM   Specimen: Urine, Clean Catch  Result Value Ref Range   Specimen Description URINE, CLEAN CATCH    Special Requests      NONE Performed at [facility], [address], [place], [place] [address]    Culture 50,000 COLONIES/mL ESCHERICHIA COLI (A)    Report Status 10/22/2023 FINAL    Organism ID, Bacteria ESCHERICHIA COLI (A)       Susceptibility   Escherichia coli - MIC*    AMPICILLIN >=32 RESISTANT Resistant     CEFAZOLIN  (URINE) Value in next row Sensitive      4 SENSITIVEThis is a modified FDA-approved test that has been validated and its performance characteristics determined by the reporting laboratory.  This laboratory is certified under the Clinical Laboratory Improvement Amendments CLIA as qualified to perform high complexity clinical laboratory testing.    CEFEPIME Value in next row Sensitive      4 SENSITIVEThis is a modified FDA-approved test that has been validated and its performance characteristics determined by the reporting laboratory.  This laboratory is certified under the Clinical Laboratory Improvement Amendments CLIA as qualified to perform high complexity clinical laboratory testing.    ERTAPENEM Value in next row Sensitive      4 SENSITIVEThis is a modified FDA-approved test that has been validated and its performance characteristics determined by the reporting laboratory.  This laboratory is certified under the Clinical  Laboratory Improvement Amendments CLIA as qualified to perform high complexity clinical laboratory testing.    CEFTRIAXONE Value in next row Sensitive      4 SENSITIVEThis is a modified FDA-approved test that has been validated and its performance characteristics determined by the reporting laboratory.  This laboratory is certified under the Clinical Laboratory Improvement Amendments CLIA as qualified to perform high complexity clinical laboratory testing.    CIPROFLOXACIN Value in next row Sensitive      4 SENSITIVEThis is a modified FDA-approved test that has been validated and its performance characteristics determined by the reporting laboratory.  This laboratory is certified under the Clinical Laboratory Improvement Amendments CLIA as qualified to perform high complexity clinical laboratory testing.    GENTAMICIN Value in next row Sensitive      4 SENSITIVEThis is a modified FDA-approved test that has been validated and its performance characteristics determined by the reporting laboratory.  This laboratory is certified under the Clinical Laboratory Improvement  Amendments CLIA as qualified to perform high complexity clinical laboratory testing.    NITROFURANTOIN  Value in next row Sensitive      4 SENSITIVEThis is a modified FDA-approved test that has been validated and its performance characteristics determined by the reporting laboratory.  This laboratory is certified under the Clinical Laboratory Improvement Amendments CLIA as qualified to perform high complexity clinical laboratory testing.    TRIMETH/SULFA Value in next row Sensitive      4 SENSITIVEThis is a modified FDA-approved test that has been validated and its performance characteristics determined by the reporting laboratory.  This laboratory is certified under the Clinical Laboratory Improvement Amendments CLIA as qualified to perform high complexity clinical laboratory testing.    AMPICILLIN/SULBACTAM Value in next row Resistant      4  SENSITIVEThis is a modified FDA-approved test that has been validated and its performance characteristics determined by the reporting laboratory.  This laboratory is certified under the Clinical Laboratory Improvement Amendments CLIA as qualified to perform high complexity clinical laboratory testing.    PIP/TAZO Value in next row Sensitive ug/mL     <=4 SENSITIVEThis is a modified FDA-approved test that has been validated and its performance characteristics determined by the reporting laboratory.  This laboratory is certified under the Clinical Laboratory Improvement Amendments CLIA as qualified to perform high complexity clinical laboratory testing.    MEROPENEM Value in next row Sensitive      <=4 SENSITIVEThis is a modified FDA-approved test that has been validated and its performance characteristics determined by the reporting laboratory.  This laboratory is certified under the Clinical Laboratory Improvement Amendments CLIA as qualified to perform high complexity clinical laboratory testing.    * 50,000 COLONIES/mL ESCHERICHIA COLI       Assessment & Plan Urticaria Hives Checking Tryptase, CBC, and CMP today.  Will call with results.  Adding Montelukast  for pt. Will reassess at follow up.   Class 3 severe obesity due to excess calories with serious comorbidity and body mass index (BMI) of 40.0 to 44.9 in adult Continue current meds.  Will adjust as needed based on results.  The patient is asked to make an attempt to improve diet and exercise patterns to aid in medical management of this problem. Addressed importance of increasing and maintaining water intake.   Urinary tract infection without hematuria, site unspecified UA in office today to check for clearing of UTI normal.      Return in about 1 month (around 11/29/2023).   Total time spent: 20 minutes  ALAN CHRISTELLA ARRANT, FNP  10/30/2023   This document may have been prepared by [facility] Voice Recognition software and as such  may include unintentional dictation errors.

## 2023-11-02 ENCOUNTER — Ambulatory Visit: Payer: Self-pay

## 2023-11-02 LAB — COMPREHENSIVE METABOLIC PANEL WITH GFR
ALT: 9 IU/L (ref 0–32)
AST: 12 IU/L (ref 0–40)
Albumin: 3.8 g/dL — ABNORMAL LOW (ref 4.0–5.0)
Alkaline Phosphatase: 54 IU/L (ref 44–121)
BUN/Creatinine Ratio: 12 (ref 9–23)
BUN: 8 mg/dL (ref 6–20)
Bilirubin Total: 0.6 mg/dL (ref 0.0–1.2)
CO2: 22 mmol/L (ref 20–29)
Calcium: 8.9 mg/dL (ref 8.7–10.2)
Chloride: 107 mmol/L — ABNORMAL HIGH (ref 96–106)
Creatinine, Ser: 0.68 mg/dL (ref 0.57–1.00)
Globulin, Total: 2.6 g/dL (ref 1.5–4.5)
Glucose: 84 mg/dL (ref 70–99)
Potassium: 4.3 mmol/L (ref 3.5–5.2)
Sodium: 142 mmol/L (ref 134–144)
Total Protein: 6.4 g/dL (ref 6.0–8.5)
eGFR: 122 mL/min/1.73 (ref >59)

## 2023-11-02 LAB — CBC WITH DIFFERENTIAL/PLATELET
Basophils Absolute: 0.1 x10E3/uL (ref 0.0–0.2)
Basos: 1 %
EOS (ABSOLUTE): 0.2 x10E3/uL (ref 0.0–0.4)
Eos: 2 %
Hematocrit: 39.8 % (ref 34.0–46.6)
Hemoglobin: 12.9 g/dL (ref 11.1–15.9)
Immature Grans (Abs): 0 x10E3/uL (ref 0.0–0.1)
Immature Granulocytes: 0 %
Lymphocytes Absolute: 2.5 x10E3/uL (ref 0.7–3.1)
Lymphs: 33 %
MCH: 30.7 pg (ref 26.6–33.0)
MCHC: 32.4 g/dL (ref 31.5–35.7)
MCV: 95 fL (ref 79–97)
Monocytes Absolute: 0.5 x10E3/uL (ref 0.1–0.9)
Monocytes: 7 %
Neutrophils Absolute: 4.3 x10E3/uL (ref 1.4–7.0)
Neutrophils: 57 %
Platelets: 283 x10E3/uL (ref 150–450)
RBC: 4.2 x10E6/uL (ref 3.77–5.28)
RDW: 12.3 % (ref 11.7–15.4)
WBC: 7.6 x10E3/uL (ref 3.4–10.8)

## 2023-11-02 LAB — TRYPTASE: Tryptase: 9.6 ug/L (ref 2.2–13.2)

## 2023-11-03 ENCOUNTER — Encounter: Payer: Self-pay | Admitting: Family

## 2023-11-03 NOTE — Assessment & Plan Note (Signed)
 Continue current meds.  Will adjust as needed based on results.  The patient is asked to make an attempt to improve diet and exercise patterns to aid in medical management of this problem. Addressed importance of increasing and maintaining water  intake.

## 2023-11-09 ENCOUNTER — Other Ambulatory Visit: Payer: Self-pay | Admitting: Family

## 2023-11-30 ENCOUNTER — Ambulatory Visit (INDEPENDENT_AMBULATORY_CARE_PROVIDER_SITE_OTHER): Admitting: Family

## 2023-11-30 ENCOUNTER — Encounter: Payer: Self-pay | Admitting: Family

## 2023-11-30 VITALS — BP 118/82 | HR 85 | Ht 66.0 in | Wt 268.0 lb

## 2023-11-30 DIAGNOSIS — Z0130 Encounter for examination of blood pressure without abnormal findings: Secondary | ICD-10-CM

## 2023-11-30 DIAGNOSIS — E66813 Obesity, class 3: Secondary | ICD-10-CM | POA: Diagnosis not present

## 2023-11-30 DIAGNOSIS — L509 Urticaria, unspecified: Secondary | ICD-10-CM | POA: Diagnosis not present

## 2023-11-30 DIAGNOSIS — Z6841 Body Mass Index (BMI) 40.0 and over, adult: Secondary | ICD-10-CM

## 2023-11-30 DIAGNOSIS — Z124 Encounter for screening for malignant neoplasm of cervix: Secondary | ICD-10-CM

## 2023-11-30 DIAGNOSIS — R232 Flushing: Secondary | ICD-10-CM | POA: Diagnosis not present

## 2023-11-30 DIAGNOSIS — R7303 Prediabetes: Secondary | ICD-10-CM

## 2023-11-30 NOTE — Progress Notes (Unsigned)
 Established Patient Office Visit  Subjective:  Patient ID: Claudia Arnold, female    DOB: [birth year]  Age: 28 y.o. MRN: [id number]  Chief Complaint  Patient presents with   Follow-up    1 month follow up    Patient is here today for her 1 month follow up.  She has been feeling fairly well since last appointment.   She does not have additional concerns to discuss today.  She has been having continued episodes of the hot flashes and itchiness.   Labs are not due today.  She needs refills.   I have reviewed her active problem list, medication list, allergies, health maintenance, notes from last encounter, lab results for her appointment today.      No other concerns at this time.   Past Medical History:  Diagnosis Date   Allergy    Heart murmur    Hyperlipemia    Hypertension     Past Surgical History:  Procedure Laterality Date   CARPAL TUNNEL RELEASE Left 10/07/2022   Procedure: ENDOSCOPIC CARPAL TUNNEL RELEASE;  Surgeon: Beverley Evalene BIRCH, MD;  [facility];  Service: Orthopedics;  Laterality: Left;    Social History   Socioeconomic History   Marital status: Single    Spouse name: Not on file   Number of children: Not on file   Years of education: Not on file   Highest education level: Not on file  Occupational History   Not on file  Tobacco Use   Smoking status: Never   Smokeless tobacco: Never  Vaping Use   Vaping status: Never Used  Substance and Sexual Activity   Alcohol use: Yes    Comment: occ   Drug use: No   Sexual activity: Yes    Birth control/protection: Pill  Other Topics Concern   Not on file  Social History Narrative   Not on file   Social Drivers of Health   Financial Resource Strain: Not on file  Food Insecurity: Not on file  Transportation Needs: Not on file  Physical Activity: Not on file  Stress: Not on file  Social Connections: Not on file  Intimate Partner Violence: Not on file    Family  History  Problem Relation Age of Onset   Hypertension Mother    High Cholesterol Mother     No Known Allergies  Review of Systems  All other systems reviewed and are negative.      Objective:   BP 118/82   Pulse 85   Ht 5' 6 (1.676 m)   Wt 268 lb (121.6 kg)   SpO2 96%   BMI 43.26 kg/m   Vitals:   11/30/23 1010  BP: 118/82  Pulse: 85  Height: 5' 6 (1.676 m)  Weight: 268 lb (121.6 kg)  SpO2: 96%  BMI (Calculated): 43.28    Physical Exam Vitals and nursing note reviewed.  Constitutional:      Appearance: Normal appearance. She is normal weight.  HENT:     Head: Normocephalic and atraumatic.  Eyes:     Extraocular Movements: Extraocular movements intact.     Conjunctiva/sclera: Conjunctivae normal.     Pupils: Pupils are equal, round, and reactive to light.  Cardiovascular:     Rate and Rhythm: Normal rate and regular rhythm.     Pulses: Normal pulses.  Pulmonary:     Effort: Pulmonary effort is normal.  Musculoskeletal:        General: Normal range of motion.  Cervical back: Normal range of motion.  Neurological:     General: No focal deficit present.     Mental Status: She is alert and oriented to person, place, and time. Mental status is at baseline.  Psychiatric:        Mood and Affect: Mood normal.        Behavior: Behavior normal.        Thought Content: Thought content normal.        Judgment: Judgment normal.      Results for orders placed or performed in visit on 11/30/23  OB RESULTS CONSOLE GC/Chlamydia  Result Value Ref Range   Chlamydia Negative   HM PAP SMEAR  Result Value Ref Range   HM Pap smear NIL   Results Console HPV  Result Value Ref Range   CHL HPV Negative     Recent Results (from the past 2160 hours)  POCT URINE DIPSTICK     Status: Abnormal   Collection Time: 10/19/23  8:38 AM  Result Value Ref Range   Color, UA yellow yellow   Clarity, UA hazy (A) clear   Glucose, UA negative negative mg/dL   Bilirubin, UA  negative negative   Ketones, POC UA negative negative mg/dL   Spec Grav, UA >=8.969 (A) 1.010 - 1.025   Blood, UA negative negative   pH, UA 5.5 5.0 - 8.0   POC PROTEIN,UA =100 (A) negative, trace   Urobilinogen, UA 1.0 0.2 or 1.0 E.U./dL   Nitrite, UA Negative Negative   Leukocytes, UA Negative Negative  POCT urine pregnancy     Status: None   Collection Time: 10/19/23  8:38 AM  Result Value Ref Range   Preg Test, Ur Negative Negative  Urine Culture     Status: Abnormal   Collection Time: 10/19/23  8:39 AM   Specimen: Urine, Clean Catch  Result Value Ref Range   Specimen Description URINE, CLEAN CATCH    Special Requests      NONE Performed at [facility], [address], [place], [place] [address]    Culture 50,000 COLONIES/mL ESCHERICHIA COLI (A)    Report Status 10/22/2023 FINAL    Organism ID, Bacteria ESCHERICHIA COLI (A)       Susceptibility   Escherichia coli - MIC*    AMPICILLIN >=32 RESISTANT Resistant     CEFAZOLIN  (URINE) Value in next row Sensitive      4 SENSITIVEThis is a modified FDA-approved test that has been validated and its performance characteristics determined by the reporting laboratory.  This laboratory is certified under the Clinical Laboratory Improvement Amendments CLIA as qualified to perform high complexity clinical laboratory testing.    CEFEPIME Value in next row Sensitive      4 SENSITIVEThis is a modified FDA-approved test that has been validated and its performance characteristics determined by the reporting laboratory.  This laboratory is certified under the Clinical Laboratory Improvement Amendments CLIA as qualified to perform high complexity clinical laboratory testing.    ERTAPENEM Value in next row Sensitive      4 SENSITIVEThis is a modified FDA-approved test that has been validated and its performance characteristics determined by the reporting laboratory.  This laboratory is certified under the Clinical Laboratory Improvement  Amendments CLIA as qualified to perform high complexity clinical laboratory testing.    CEFTRIAXONE Value in next row Sensitive      4 SENSITIVEThis is a modified FDA-approved test that has been validated and its performance characteristics determined by the  reporting laboratory.  This laboratory is certified under the Clinical Laboratory Improvement Amendments CLIA as qualified to perform high complexity clinical laboratory testing.    CIPROFLOXACIN Value in next row Sensitive      4 SENSITIVEThis is a modified FDA-approved test that has been validated and its performance characteristics determined by the reporting laboratory.  This laboratory is certified under the Clinical Laboratory Improvement Amendments CLIA as qualified to perform high complexity clinical laboratory testing.    GENTAMICIN Value in next row Sensitive      4 SENSITIVEThis is a modified FDA-approved test that has been validated and its performance characteristics determined by the reporting laboratory.  This laboratory is certified under the Clinical Laboratory Improvement Amendments CLIA as qualified to perform high complexity clinical laboratory testing.    NITROFURANTOIN  Value in next row Sensitive      4 SENSITIVEThis is a modified FDA-approved test that has been validated and its performance characteristics determined by the reporting laboratory.  This laboratory is certified under the Clinical Laboratory Improvement Amendments CLIA as qualified to perform high complexity clinical laboratory testing.    TRIMETH/SULFA Value in next row Sensitive      4 SENSITIVEThis is a modified FDA-approved test that has been validated and its performance characteristics determined by the reporting laboratory.  This laboratory is certified under the Clinical Laboratory Improvement Amendments CLIA as qualified to perform high complexity clinical laboratory testing.    AMPICILLIN/SULBACTAM Value in next row Resistant      4 SENSITIVEThis is a  modified FDA-approved test that has been validated and its performance characteristics determined by the reporting laboratory.  This laboratory is certified under the Clinical Laboratory Improvement Amendments CLIA as qualified to perform high complexity clinical laboratory testing.    PIP/TAZO Value in next row Sensitive ug/mL     <=4 SENSITIVEThis is a modified FDA-approved test that has been validated and its performance characteristics determined by the reporting laboratory.  This laboratory is certified under the Clinical Laboratory Improvement Amendments CLIA as qualified to perform high complexity clinical laboratory testing.    MEROPENEM Value in next row Sensitive      <=4 SENSITIVEThis is a modified FDA-approved test that has been validated and its performance characteristics determined by the reporting laboratory.  This laboratory is certified under the Clinical Laboratory Improvement Amendments CLIA as qualified to perform high complexity clinical laboratory testing.    * 50,000 COLONIES/mL ESCHERICHIA COLI  CBC with Diff     Status: None   Collection Time: 10/30/23 11:09 AM  Result Value Ref Range   WBC 7.6 3.4 - 10.8 x10E3/uL   RBC 4.20 3.77 - 5.28 x10E6/uL   Hemoglobin 12.9 11.1 - 15.9 g/dL   Hematocrit 60.1 65.9 - 46.6 %   MCV 95 79 - 97 fL   MCH 30.7 26.6 - 33.0 pg   MCHC 32.4 31.5 - 35.7 g/dL   RDW 87.6 88.2 - 84.5 %   Platelets 283 150 - 450 x10E3/uL   Neutrophils 57 Not Estab. %   Lymphs 33 Not Estab. %   Monocytes 7 Not Estab. %   Eos 2 Not Estab. %   Basos 1 Not Estab. %   Neutrophils Absolute 4.3 1.4 - 7.0 x10E3/uL   Lymphocytes Absolute 2.5 0.7 - 3.1 x10E3/uL   Monocytes Absolute 0.5 0.1 - 0.9 x10E3/uL   EOS (ABSOLUTE) 0.2 0.0 - 0.4 x10E3/uL   Basophils Absolute 0.1 0.0 - 0.2 x10E3/uL   Immature Granulocytes 0 Not Estab. %  Immature Grans (Abs) 0.0 0.0 - 0.1 x10E3/uL  Comprehensive metabolic panel with GFR     Status: Abnormal   Collection Time: 10/30/23 11:09  AM  Result Value Ref Range   Glucose 84 70 - 99 mg/dL   BUN 8 6 - 20 mg/dL   Creatinine, Ser 9.31 0.57 - 1.00 mg/dL   eGFR 877 >40 fO/fpw/8.26   BUN/Creatinine Ratio 12 9 - 23   Sodium 142 134 - 144 mmol/L   Potassium 4.3 3.5 - 5.2 mmol/L   Chloride 107 (H) 96 - 106 mmol/L   CO2 22 20 - 29 mmol/L   Calcium  8.9 8.7 - 10.2 mg/dL   Total Protein 6.4 6.0 - 8.5 g/dL   Albumin 3.8 (L) 4.0 - 5.0 g/dL   Globulin, Total 2.6 1.5 - 4.5 g/dL   Bilirubin Total 0.6 0.0 - 1.2 mg/dL   Alkaline Phosphatase 54 44 - 121 IU/L    Comment: **Effective November 09, 2023 Alkaline Phosphatase**   reference interval will be changing to:              Age                Female          Female           0 -  5 days         47 - 127       47 - 127           6 - 10 days         29 - 242       29 - 242          11 - 20 days        109 - 357      109 - 357          21 - 30 days         94 - 494       94 - 494           1 -  2 months      149 - 539      149 - 539           3 -  6 months      131 - 452      131 - 452           7 - 11 months      117 - 401      117 - 401   12 months -  6 years       158 - 369      158 - 369           7 - 12 years       150 - 409      150 - 409               13 years       156 - 435       78 - 227               14 years       114 - 375       64 - 161               15 years        88 - 279       56 - 134  16 years        74 - 207       51 - 121               17 years        63 - 161       47 - 113          18 - 20 years        51 - 125       42 - 106          21 - 50 years         47 - 123       41 - 116          51 - 80 years        49 - 135       51 - 125              >80 years        48 - 129       48 - 129    AST 12 0 - 40 IU/L   ALT 9 0 - 32 IU/L  Tryptase     Status: None   Collection Time: 10/30/23 11:09 AM  Result Value Ref Range   Tryptase 9.6 2.2 - 13.2 ug/L  POCT urinalysis dipstick     Status: Normal   Collection Time: 10/30/23 11:12 AM  Result Value  Ref Range   Color, UA yellow    Clarity, UA clear    Glucose, UA Negative Negative   Bilirubin, UA Negative    Ketones, UA Negative    Spec Grav, UA 1.020 1.010 - 1.025   Blood, UA Negative    pH, UA 7.0 5.0 - 8.0   Protein, UA Negative Negative   Urobilinogen, UA 0.2 0.2 or 1.0 E.U./dL   Nitrite, UA Negative    Leukocytes, UA Negative Negative   Appearance     Odor         Assessment & Plan Class 3 severe obesity due to excess calories with serious comorbidity and body mass index (BMI) of 40.0 to 44.9 in adult Claudia Arnold) Continue current meds.  Will adjust as needed based on results.  The patient is asked to make an attempt to improve diet and exercise patterns to aid in medical management of this problem. Addressed importance of increasing and maintaining water intake.   Prediabetes A1C Continues to be in prediabetic ranges.  Will reassess at follow up after next lab check.  Patient counseled on dietary choices and verbalized understanding.   Urticaria Hot flashes Patient will bring her BG monitor to next appointment so we can help her use it and allow her to check her sugars when she is having episodes.      Return in about 2 months (around 01/30/2024).   Total time spent: 20 minutes  ALAN CHRISTELLA ARRANT, FNP  11/30/2023   This document may have been prepared by [facility] Voice Recognition software and as such may include unintentional dictation errors.

## 2023-12-02 NOTE — Assessment & Plan Note (Signed)
 Continue current meds.  Will adjust as needed based on results.  The patient is asked to make an attempt to improve diet and exercise patterns to aid in medical management of this problem. Addressed importance of increasing and maintaining water  intake.

## 2023-12-02 NOTE — Assessment & Plan Note (Signed)
 A1C Continues to be in prediabetic ranges.  Will reassess at follow up after next lab check.  Patient counseled on dietary choices and verbalized understanding.

## 2023-12-05 ENCOUNTER — Other Ambulatory Visit: Payer: Self-pay | Admitting: Cardiology

## 2024-02-01 ENCOUNTER — Ambulatory Visit: Admitting: Family

## 2024-02-01 ENCOUNTER — Encounter: Payer: Self-pay | Admitting: Family

## 2024-02-01 VITALS — BP 132/82 | HR 79 | Ht 66.0 in | Wt 270.8 lb

## 2024-02-01 DIAGNOSIS — Z124 Encounter for screening for malignant neoplasm of cervix: Secondary | ICD-10-CM

## 2024-02-01 DIAGNOSIS — Z0000 Encounter for general adult medical examination without abnormal findings: Secondary | ICD-10-CM

## 2024-02-01 DIAGNOSIS — Z1272 Encounter for screening for malignant neoplasm of vagina: Secondary | ICD-10-CM

## 2024-02-01 DIAGNOSIS — Z0130 Encounter for examination of blood pressure without abnormal findings: Secondary | ICD-10-CM

## 2024-02-01 DIAGNOSIS — Z202 Contact with and (suspected) exposure to infections with a predominantly sexual mode of transmission: Secondary | ICD-10-CM

## 2024-02-01 DIAGNOSIS — N760 Acute vaginitis: Secondary | ICD-10-CM

## 2024-02-01 DIAGNOSIS — Z0001 Encounter for general adult medical examination with abnormal findings: Secondary | ICD-10-CM

## 2024-02-01 DIAGNOSIS — Z1151 Encounter for screening for human papillomavirus (HPV): Secondary | ICD-10-CM

## 2024-02-01 DIAGNOSIS — Z131 Encounter for screening for diabetes mellitus: Secondary | ICD-10-CM

## 2024-02-01 MED ORDER — LIDOCAINE 5 % EX PTCH: 1.0000 | MEDICATED_PATCH | CUTANEOUS | 1 refills | Status: AC

## 2024-02-01 MED ORDER — NIFEDIPINE ER OSMOTIC RELEASE 30 MG PO TB24: 30.0000 mg | ORAL_TABLET | Freq: Every day | ORAL | 1 refills | Status: DC

## 2024-02-01 NOTE — Progress Notes (Signed)
 Complete physical exam  Patient: Claudia Arnold   DOB: [birth year]   28 y.o. Female  MRN: [id number]  Subjective:    Chief Complaint  Patient presents with   Annual Exam    CPE and PAP    [name] is a 28 y.o. female who presents today for a complete physical exam. She reports consuming a general diet.  She generally feels fairly well. She reports sleeping fairly well. She does not have additional problems to discuss today.    Most recent depression screenings:    08/10/2023   10:59 AM 06/12/2016    9:58 AM  PHQ 2/9 Scores  PHQ - 2 Score 0 0  PHQ- 9 Score 5       Data saved with a previous flowsheet row definition      Past Medical History:  Diagnosis Date   Allergy    Heart murmur    Hyperlipemia    Hypertension     Past Surgical History:  Procedure Laterality Date   CARPAL TUNNEL RELEASE Left 10/07/2022   Procedure: ENDOSCOPIC CARPAL TUNNEL RELEASE;  Surgeon: Beverley Evalene BIRCH, MD;  Location: [facility];  Service: Orthopedics;  Laterality: Left;    Family History  Problem Relation Age of Onset   Hypertension Mother    High Cholesterol Mother     Social History   Socioeconomic History   Marital status: Single    Spouse name: Not on file   Number of children: Not on file   Years of education: Not on file   Highest education level: Not on file  Occupational History   Not on file  Tobacco Use   Smoking status: Never   Smokeless tobacco: Never  Vaping Use   Vaping status: Never Used  Substance and Sexual Activity   Alcohol use: Yes    Comment: occ   Drug use: No   Sexual activity: Yes    Birth control/protection: Pill  Other Topics Concern   Not on file  Social History Narrative   Not on file   Social Drivers of Health   Financial Resource Strain: Not on file  Food Insecurity: Not on file  Transportation Needs: Not on file  Physical Activity: Not on file  Stress: Not on file  Social Connections: Not on file  Intimate  Partner Violence: Not on file    Outpatient Medications Prior to Visit  Medication Sig   acetaminophen  (TYLENOL ) 500 MG tablet Take 2 tablets (1,000 mg total) by mouth every 8 (eight) hours as needed for mild pain or moderate pain.   BIOTIN BEAUTY EXTRA STRENGTH PO Take by mouth.   chlorthalidone  (HYGROTON ) 25 MG tablet Take 1 tablet (25 mg total) by mouth daily.   famotidine  (PEPCID ) 40 MG tablet Take 1 tablet (40 mg total) by mouth daily.   montelukast  (SINGULAIR ) 10 MG tablet TAKE 1 TABLET BY MOUTH EVERY DAY   Prenatal Vit-Fe Fumarate-FA (PRENATAL MULTIVITAMIN) TABS tablet Take 1 tablet by mouth daily at 12 noon.   rosuvastatin  (CRESTOR ) 20 MG tablet Take 1 tablet (20 mg total) by mouth daily.   [DISCONTINUED] amLODipine  (NORVASC ) 5 MG tablet Take 1 tablet (5 mg total) by mouth daily.   [DISCONTINUED] losartan  (COZAAR ) 100 MG tablet Take 1 tablet (100 mg total) by mouth daily.   [DISCONTINUED] Magnesium 400 MG CAPS Take by mouth.   [DISCONTINUED] norgestimate -ethinyl estradiol  (ORTHO-CYCLEN) 0.25-35 MG-MCG tablet TAKE 1 TABLET BY MOUTH EVERY DAY   [DISCONTINUED] phentermine  (ADIPEX-P ) 37.5  MG tablet Take 1 tablet (37.5 mg total) by mouth daily before breakfast.   [DISCONTINUED] Rimegepant Sulfate (NURTEC) 75 MG TBDP Take 1 tablet (75 mg total) by mouth as needed (for migraine.  Max 1 tablet per day.).   [DISCONTINUED] spironolactone  (ALDACTONE ) 25 MG tablet Take 1 tablet (25 mg total) by mouth daily.   [DISCONTINUED] vitamin B-12 (CYANOCOBALAMIN) 100 MCG tablet Take 100 mcg by mouth daily.   [DISCONTINUED] vitamin C (ASCORBIC ACID) 250 MG tablet Take 250 mg by mouth daily.   [DISCONTINUED] Vitamin D , Ergocalciferol , (DRISDOL ) 1.25 MG (50000 UNIT) CAPS capsule Take 1 capsule (50,000 Units total) by mouth every 7 (seven) days.   [DISCONTINUED] cephALEXin  (KEFLEX ) 500 MG capsule Take 1 capsule (500 mg total) by mouth 2 (two) times daily. (Patient not taking: Reported on 02/01/2024)    [DISCONTINUED] Cholecalciferol (D3) 50 MCG (2000 UT) CHEW Chew by mouth. (Patient not taking: Reported on 02/01/2024)   No facility-administered medications prior to visit.    Review of Systems  All other systems reviewed and are negative.       Objective:     BP 132/82   Pulse 79   Ht 5' 6 (1.676 m)   Wt 270 lb 12.8 oz (122.8 kg)   LMP 11/02/2023 (Exact Date)   SpO2 99%   BMI 43.71 kg/m    Physical Exam Vitals and nursing note reviewed.  Constitutional:      Appearance: Normal appearance. She is normal weight.  HENT:     Head: Normocephalic.  Eyes:     Extraocular Movements: Extraocular movements intact.     Conjunctiva/sclera: Conjunctivae normal.     Pupils: Pupils are equal, round, and reactive to light.  Cardiovascular:     Rate and Rhythm: Normal rate.  Pulmonary:     Effort: Pulmonary effort is normal.  Neurological:     General: No focal deficit present.     Mental Status: She is alert and oriented to person, place, and time. Mental status is at baseline.  Psychiatric:        Mood and Affect: Mood normal.        Behavior: Behavior normal.        Thought Content: Thought content normal.      No results found for any visits on 02/01/24.  No results found for this or any previous visit (from the past 2160 hours).      Assessment & Plan:    Routine Health Maintenance and Physical Exam  Immunization History  Administered Date(s) Administered   DTaP 07/08/1995, 09/07/1995, 11/24/1996, 02/02/1998, 06/19/2000   HPV Quadrivalent 07/28/2007, 07/27/2013   Hepatitis B 13-Oct-1995, 06/08/1995, 02/02/1998   IPV 07/08/1995, 09/07/1995, 11/24/1996, 06/19/2000   Influenza,inj,Quad PF,6+ Mos 12/01/2014   MMR 11/24/1996, 06/19/2000   Meningococcal Conjugate 07/28/2007, 07/27/2013   PFIZER(Purple Top)SARS-COV-2 Vaccination 05/13/2019, 06/03/2019   Tdap 11/16/2006   Varicella 11/24/1996, 07/28/2007    Health Maintenance  Topic Date Due   DTaP/Tdap/Td (7 -  Td or Tdap) 11/15/2016   COVID-19 Vaccine (3 - Pfizer risk series) 07/01/2019   Influenza Vaccine  09/25/2023   Cervical Cancer Screening (Pap smear)  02/04/2025   Hepatitis B Vaccines 19-59 Average Risk  Completed   HPV VACCINES  Completed   Hepatitis C Screening  Completed   HIV Screening  Completed   Pneumococcal Vaccine  Aged Out   Meningococcal B Vaccine  Aged Out    Discussed health benefits of physical activity, and encouraged her to engage in regular exercise  appropriate for her age and condition.  Assessment & Plan Routine general medical examination at a health care facility Vaginal Pap smear Acute vaginitis Exposure to sexually transmitted disease (STD) Screening for cervical cancer Screening for human papillomavirus (HPV) Pap smear completed in office today.  Will call with results when available.   OB/GYN recommendation given for pt. She will contact them to set up pregnancy care.     No follow-ups on file.     ALAN CHRISTELLA ARRANT, FNP  02/01/2024   This document may have been prepared by [facility] Voice Recognition software and as such may include unintentional dictation errors.

## 2024-02-02 LAB — IGP,CTNGTV,APT HPV,RFX16/18,45
Chlamydia, Nuc. Acid Amp: NEGATIVE
Gonococcus, Nuc. Acid Amp: NEGATIVE
HPV Aptima: NEGATIVE
PAP Smear Comment: 0
Trich vag by NAA: NEGATIVE

## 2024-02-04 ENCOUNTER — Ambulatory Visit: Payer: Self-pay

## 2024-02-09 DIAGNOSIS — I10 Essential (primary) hypertension: Secondary | ICD-10-CM | POA: Insufficient documentation

## 2024-02-09 DIAGNOSIS — Z3490 Encounter for supervision of normal pregnancy, unspecified, unspecified trimester: Secondary | ICD-10-CM | POA: Insufficient documentation

## 2024-02-15 ENCOUNTER — Encounter: Payer: BC Managed Care – PPO | Admitting: Family

## 2024-02-25 ENCOUNTER — Ambulatory Visit
Admission: RE | Admit: 2024-02-25 | Discharge: 2024-02-25 | Disposition: A | Discharge status: Home / Self Care | Source: Ambulatory Visit

## 2024-02-25 VITALS — BP 138/87 | HR 97 | Temp 98.6°F | Resp 18

## 2024-02-25 DIAGNOSIS — R051 Acute cough: Secondary | ICD-10-CM | POA: Diagnosis not present

## 2024-02-25 DIAGNOSIS — J0110 Acute frontal sinusitis, unspecified: Secondary | ICD-10-CM | POA: Diagnosis not present

## 2024-02-25 MED ORDER — AMOXICILLIN 500 MG PO CAPS: 500.0000 mg | ORAL_CAPSULE | Freq: Three times a day (TID) | ORAL | 0 refills | Status: DC

## 2024-02-25 MED ORDER — ONDANSETRON 4 MG PO TBDP: 4.0000 mg | ORAL_TABLET | Freq: Three times a day (TID) | ORAL | 0 refills | Status: AC | PRN

## 2024-02-25 NOTE — ED Provider Notes (Signed)
 " [facility]    CSN: [id number] Arrival date & time: 02/25/24  1421      History   Chief Complaint Chief Complaint  Patient presents with   Cough    Heavy mucus, cough, blood in mucus, can't keep food or liquids down, throwing up after eating with lots of mucus and pregnant - Entered by patient    HPI Claudia Arnold is a 29 y.o. female.   Patient presents today with a productive cough and nasal congestion x 1 week.  Patient states that she had emesis after coughing x 1.  Unknown documented fever states that she just feels hot intermittently.  Patient is pregnant and so she has only been taking Tylenol .  Denies any chest pain no shortness of breath.    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 Authorizing Provider  acetaminophen  (TYLENOL ) 500 MG tablet Take 2 tablets (1,000 mg total) by mouth every 8 (eight) hours as needed for  mild pain or moderate pain. 10/07/22   Gawne, Meghan M, PA-C  amoxicillin  (AMOXIL ) 500 MG capsule Take 1 capsule (500 mg total) by mouth 3 (three) times daily. 02/25/24  Yes Merilee Andrea CROME, NP  BIOTIN BEAUTY EXTRA STRENGTH PO Take by mouth.    [provider]  chlorthalidone  (HYGROTON ) 25 MG tablet Take 1 tablet (25 mg total) by mouth daily. 09/11/23   Orlean Alan HERO, FNP  famotidine  (PEPCID ) 40 MG tablet Take 1 tablet (40 mg total) by mouth daily. 09/11/23   Orlean Alan HERO, FNP  lidocaine  (LIDODERM ) 5 % Place 1 patch onto the skin daily. Remove & Discard patch within 12 hours or as directed by MD 02/01/24   Orlean Alan HERO, FNP  montelukast  (SINGULAIR ) 10 MG tablet TAKE 1 TABLET BY MOUTH EVERY DAY 11/12/23   Orlean Alan HERO, FNP  NIFEdipine  (PROCARDIA -XL/NIFEDICAL-XL) 30 MG 24 hr tablet Take 1 tablet (30 mg total) by mouth daily. 02/01/24   Orlean Alan HERO, FNP  Prenatal Vit-Fe Fumarate-FA (PRENATAL MULTIVITAMIN) TABS tablet Take 1 tablet by mouth daily at 12 noon.    [provider]  rosuvastatin  (CRESTOR ) 20 MG tablet Take 1 tablet (20 mg total) by mouth daily. 09/11/23   Orlean Alan HERO, FNP    Family History Family History  Problem Relation Age of Onset   Hypertension Mother    High Cholesterol Mother     Social History Social History[1]   Allergies   Patient has no known allergies.   Review of Systems Review of Systems  Constitutional:  Negative for fever.  HENT:  Positive for congestion, postnasal drip and sinus pain. Negative for ear pain.   Respiratory:  Positive for cough. Negative for shortness of breath.   Cardiovascular: Negative.   Gastrointestinal:  Positive for vomiting.       Emesis x 1 with coughing  Genitourinary: Negative.   Musculoskeletal: Negative.   Skin: Negative.   Neurological: Negative.      Physical Exam Triage Vital Signs ED Triage Vitals  Encounter Vitals Group     BP 02/25/24 1459 138/87     Girls Systolic BP  Percentile --      Girls Diastolic BP Percentile --      Boys Systolic BP Percentile --      Boys Diastolic BP Percentile --      Pulse Rate 02/25/24 1459 97     Resp 02/25/24 1459 18     Temp 02/25/24 1459 98.6 F (37 C)     Temp src --      SpO2 02/25/24 1459 93 %     Weight --      Height --      Head Circumference --      Peak Flow --      Pain Score 02/25/24 1458 5     Pain Loc --      Pain Education --      Exclude from Growth Chart --    No data found.  Updated Vital Signs BP 138/87   Pulse 97   Temp 98.6 F (37 C)   Resp 18   LMP 11/02/2023 (Exact Date)   SpO2 93%   Visual Acuity Right Eye Distance:   Left Eye Distance:   Bilateral Distance:    Right Eye Near:   Left Eye Near:    Bilateral Near:     Physical Exam Constitutional:      Appearance: Normal appearance.  HENT:     Right Ear: Tympanic membrane normal.     Left Ear: Tympanic membrane normal.     Nose: Congestion present.  Eyes:     Pupils: Pupils are equal, round, and reactive to light.  Cardiovascular:     Rate and Rhythm: Normal rate.  Pulmonary:     Effort: Pulmonary effort is normal.  Abdominal:     General: Abdomen is flat.  Skin:    General: Skin is warm.  Neurological:     General: No focal deficit present.     Mental Status: She is alert.      UC Treatments / Results  Labs (all labs ordered are listed, but only abnormal results are displayed) Labs Reviewed - No data to display  EKG   Radiology No results found.  Procedures Procedures (including critical care time)  Medications Ordered in UC Medications - No data to display  Initial Impression / Assessment and Plan / UC Course  I have reviewed the triage vital signs and the nursing notes.  Pertinent labs & imaging results that were available during my care of the patient were reviewed by me  and considered in my medical decision making (see chart for details).     There are symptoms present with  sinusitis Take full dose of antibiotics with food Take Tylenol  as needed Can take a half of Benadryl  at nighttime Using humidifier as needed in the evening to help with cough Call your OB/GYN for follow-up If symptoms become worse follow-up at [facility] Final Clinical Impressions(s) / UC Diagnoses   Final diagnoses:  Acute cough  Acute non-recurrent frontal sinusitis     Discharge Instructions      There are symptoms present with sinusitis Take full dose of antibiotics with food Take Tylenol  as needed Can take a half of Benadryl  at nighttime Using humidifier as needed in the evening to help with cough Call your OB/GYN for follow-up If symptoms become worse follow-up at [facility]     ED Prescriptions     Medication Sig Dispense Auth. Provider   amoxicillin  (AMOXIL ) 500 MG capsule Take 1 capsule (500 mg total) by mouth 3 (three) times daily. 21 capsule Merilee Andrea CROME, NP      PDMP not reviewed this encounter.    [1]  Social History Tobacco Use   Smoking status: Never   Smokeless tobacco: Never  Vaping Use   Vaping status: Never Used  Substance Use Topics   Alcohol use: Yes    Comment: occ   Drug use: No     Merilee Andrea CROME, NP 02/25/24 1534  "

## 2024-02-25 NOTE — ED Triage Notes (Signed)
 Pt present with c/o a productive cough, vomiting after coughing x 1 week. States she has not been able to eat or drink fluids. Pt states the cough is causing a crackle sound in the chest and rib soreness. Pt states when she wakes up in the morning she feels hot.  States she is pregnant and has only taken tylenol .

## 2024-02-25 NOTE — Discharge Instructions (Addendum)
 There are symptoms present with sinusitis Take full dose of antibiotics with food Take Tylenol  as needed Can take a half of Benadryl  at nighttime Using humidifier as needed in the evening to help with cough Call your OB/GYN for follow-up If symptoms become worse follow-up at [facility]

## 2024-02-28 ENCOUNTER — Other Ambulatory Visit: Payer: Self-pay | Admitting: Family

## 2024-02-29 ENCOUNTER — Other Ambulatory Visit: Payer: Self-pay | Admitting: Obstetrics and Gynecology

## 2024-02-29 DIAGNOSIS — O10919 Unspecified pre-existing hypertension complicating pregnancy, unspecified trimester: Secondary | ICD-10-CM

## 2024-02-29 LAB — LAB REPORT - SCANNED
A1c: 5.4
Creatinine, POC: 400 mg/dL
EGFR: 128
HM HIV Screening: NEGATIVE
HM Hepatitis Screen: NEGATIVE

## 2024-03-05 ENCOUNTER — Other Ambulatory Visit: Payer: Self-pay | Admitting: Internal Medicine

## 2024-03-05 ENCOUNTER — Other Ambulatory Visit: Payer: Self-pay | Admitting: Family

## 2024-03-25 ENCOUNTER — Ambulatory Visit: Attending: Obstetrics and Gynecology | Admitting: Obstetrics

## 2024-03-25 ENCOUNTER — Encounter: Payer: Self-pay | Admitting: Cardiology

## 2024-03-25 ENCOUNTER — Ambulatory Visit

## 2024-03-25 ENCOUNTER — Ambulatory Visit: Admitting: Cardiology

## 2024-03-25 ENCOUNTER — Telehealth: Payer: Self-pay | Admitting: Pharmacist

## 2024-03-25 VITALS — BP 133/71 | HR 72

## 2024-03-25 VITALS — BP 130/70 | HR 69 | Ht 66.0 in | Wt 261.0 lb

## 2024-03-25 DIAGNOSIS — O99012 Anemia complicating pregnancy, second trimester: Secondary | ICD-10-CM | POA: Diagnosis not present

## 2024-03-25 DIAGNOSIS — O99212 Obesity complicating pregnancy, second trimester: Secondary | ICD-10-CM | POA: Diagnosis not present

## 2024-03-25 DIAGNOSIS — E782 Mixed hyperlipidemia: Secondary | ICD-10-CM

## 2024-03-25 DIAGNOSIS — D649 Anemia, unspecified: Secondary | ICD-10-CM

## 2024-03-25 DIAGNOSIS — Z3A20 20 weeks gestation of pregnancy: Secondary | ICD-10-CM

## 2024-03-25 DIAGNOSIS — O10912 Unspecified pre-existing hypertension complicating pregnancy, second trimester: Secondary | ICD-10-CM | POA: Diagnosis present

## 2024-03-25 DIAGNOSIS — O99810 Abnormal glucose complicating pregnancy: Secondary | ICD-10-CM | POA: Diagnosis not present

## 2024-03-25 DIAGNOSIS — E669 Obesity, unspecified: Secondary | ICD-10-CM | POA: Diagnosis not present

## 2024-03-25 DIAGNOSIS — Z363 Encounter for antenatal screening for malformations: Secondary | ICD-10-CM | POA: Insufficient documentation

## 2024-03-25 DIAGNOSIS — I119 Hypertensive heart disease without heart failure: Secondary | ICD-10-CM | POA: Diagnosis not present

## 2024-03-25 DIAGNOSIS — O10012 Pre-existing essential hypertension complicating pregnancy, second trimester: Secondary | ICD-10-CM

## 2024-03-25 DIAGNOSIS — R011 Cardiac murmur, unspecified: Secondary | ICD-10-CM | POA: Diagnosis not present

## 2024-03-25 DIAGNOSIS — I10 Essential (primary) hypertension: Secondary | ICD-10-CM

## 2024-03-25 DIAGNOSIS — O10919 Unspecified pre-existing hypertension complicating pregnancy, unspecified trimester: Secondary | ICD-10-CM | POA: Diagnosis not present

## 2024-03-25 MED ORDER — NIFEDIPINE ER OSMOTIC RELEASE 60 MG PO TB24: 60.0000 mg | ORAL_TABLET | Freq: Every day | ORAL | 1 refills | Status: AC

## 2024-03-25 NOTE — Patient Instructions (Addendum)
 Medication Instructions:  STOP TAKING AMLODIPINE . INCREASE YOUR NIFEDIPINE  TO 60 MG (1 TABLET) DAILY.  Lab Work: NONE TO BE DONE TODAY.  Testing/Procedures: Your physician has requested that you have an echocardiogram. Echocardiography is a painless test that uses sound waves to create images of your heart. It provides your doctor with information about the size and shape of your heart and how well your hearts chambers and valves are working. This procedure takes approximately one hour. There are no restrictions for this procedure. Please do NOT wear cologne, perfume, aftershave, or lotions (deodorant is allowed). Please arrive 15 minutes prior to your appointment time.  Please note: We ask at that you not bring children with you during ultrasound (echo/ vascular) testing. Due to room size and safety concerns, children are not allowed in the ultrasound rooms during exams. Our front office staff cannot provide observation of children in our lobby area while testing is being conducted. An adult accompanying a patient to their appointment will only be allowed in the ultrasound room at the discretion of the ultrasound technician under special circumstances. We apologize for any inconvenience.   Follow-Up: At [facility], you and your health needs are our priority.  As part of our continuing mission to provide you with exceptional heart care, our providers are all part of one team.  This team includes your primary Cardiologist (physician) and Advanced Practice Providers or APPs (Physician Assistants and Nurse Practitioners) who all work together to provide you with the care you need, when you need it.  Your next appointment:   3-4 WEEKS WITH CHRIS PAVERO (04-29-24 AT 3: 30PM) 12- 16 WEEKS  Provider:   MADISON FOUNTAIN, DNP    Other Instructions:

## 2024-03-25 NOTE — Telephone Encounter (Signed)
 In room consult after appt with Dr Sheena.  Currently on chlorthalidone  and nifedipine . Nifedipine  increasing to 60mg  daily. F/u made for 4 weeks.

## 2024-03-26 NOTE — Progress Notes (Signed)
 MFM Consult Note  Claudia Arnold is currently at [redacted]w[redacted]d. She was seen today for a detailed fetal anatomy scan due to maternal obesity with a BMI of 43.9 and chronic hypertension currently treated with nifedipine  30 mg daily.  She denies any problems in her current pregnancy.    She had a cell free DNA test earlier in her pregnancy which indicated a low risk for trisomy 97, 33, and 13. A female fetus is predicted.   Sonographic findings Single intrauterine pregnancy at 20w 4d. Fetal cardiac activity:  Observed and appears normal. Presentation: Breech. The views of the fetal anatomy were limited today due to maternal body habitus.  What was visualized today appeared within normal limits. Fetal biometry shows the estimated fetal weight of 0 lb 12 oz, 352 grams (36%). Amniotic fluid: Within normal limits.  MVP: 6.74 cm. Placenta: Anterior. Adnexa: No abnormality visualized. Cervical length: 4.1 cm.  The patient was informed that anomalies may be missed due to technical limitations. If the fetus is in a suboptimal position or maternal habitus is increased, visualization of the fetus in the maternal uterus may be impaired.  Chronic Hypertension in Pregnancy and maternal obesity  The implications and management of chronic hypertension in pregnancy was discussed.   She was advised to continue taking nifedipine  as prescribed for the duration of her pregnancy.  The increased risk of superimposed preeclampsia, an indicated preterm delivery, and possible fetal growth restriction due to chronic hypertension in pregnancy was discussed.   The patient was advised that we will continue to follow her with monthly growth ultrasounds.  Weekly fetal testing should be started at 32 weeks and continued weekly until delivery.  Delivery will be recommended at between 37 to 39 weeks depending on her blood pressure control and whether or not she develops preeclampsia.  To decrease her risk of superimposed  preeclampsia, she should start taking a daily baby aspirin (81 mg daily) for preeclampsia prophylaxis.   A follow-up exam was scheduled in 5 weeks.  The patient stated that all of her questions were answered.   A total of 30 minutes was spent counseling and coordinating the care for this patient.  Greater than 50% of the time was spent in direct face-to-face contact.

## 2024-03-27 NOTE — Progress Notes (Signed)
 "  [facility]stetrics Clinic  New Evaluation  Date:  03/27/2024   ID:  Claudia Arnold, DOB 1995/11/18, MRN [id number]  PCP:  Orlean Alan HERO, FNP   [facility] Providers Cardiologist:  None  Electrophysiologist:  None       Referring MD: Verta Blossom, CNM   Chief Complaint:  I am ok  History of Present Illness:    Claudia Arnold is a 29 y.o. female [G1P0] who is being seen today for the evaluation of chronic hypertension at the request of Verta Blossom, CNM.   Medical hx includes hypertension and Morbid obesity.   She is [redacted] weeks pregnant with her first child and was diagnosed with hypertension at age 42 during a stressful period. Her family history includes hypertension in her mother and intermittent hypertension in her father.  She previously used Crestor  for cholesterol but stopped it in December. She currently takes nifedipine , amlodipine , and chlorthalidone  for blood pressure. Over the past 2 days she has vomited about 30 minutes after taking these medications and her prenatal vitamins.  She checks blood pressure at home daily. Readings are usually in the 120s to low 130s, with the highest recorded at 141 around Thanksgiving. She reports snoring, especially when very tired, and denies shortness of breath.   Prior CV Studies Reviewed: The following studies were reviewed today:   Past Medical History:  Diagnosis Date   Allergy    Heart murmur    Hyperlipemia    Hypertension     Past Surgical History:  Procedure Laterality Date   CARPAL TUNNEL RELEASE Left 10/07/2022   Procedure: ENDOSCOPIC CARPAL TUNNEL RELEASE;  Surgeon: Beverley Evalene BIRCH, MD;  Location: [facility];  Service: Orthopedics;  Laterality: Left;      OB History     Gravida  1   Para      Term      Preterm      AB      Living         SAB      IAB      Ectopic      Multiple      Live Births                  Current Medications: Active  Medications[1]   Allergies:   Patient has no known allergies.   Social History   Socioeconomic History   Marital status: Single    Spouse name: Not on file   Number of children: Not on file   Years of education: Not on file   Highest education level: Not on file  Occupational History   Not on file  Tobacco Use   Smoking status: Never   Smokeless tobacco: Never  Vaping Use   Vaping status: Never Used  Substance and Sexual Activity   Alcohol use: Yes    Comment: occ   Drug use: No   Sexual activity: Yes    Birth control/protection: Pill  Other Topics Concern   Not on file  Social History Narrative   Not on file   Social Drivers of Health   Tobacco Use: Low Risk (03/25/2024)   Patient History    Smoking Tobacco Use: Never    Smokeless Tobacco Use: Never    Passive Exposure: Not on file  Financial Resource Strain: Not on file  Food Insecurity: Not on file  Transportation Needs: Not on file  Physical Activity: Not on file  Stress: Not on file  Social Connections: Not on file  Depression (PHQ2-9): Medium Risk (08/10/2023)   Depression (PHQ2-9)    PHQ-2 Score: 5  Alcohol Screen: Not on file  Housing: Not on file  Utilities: Not on file  Health Literacy: Not on file      Family History  Problem Relation Age of Onset   Hypertension Mother    High Cholesterol Mother       ROS:   Please see the history of present illness.     All other systems reviewed and are negative.   Labs/EKG Reviewed:    EKG:   EKG is  ordered today.  The ekg ordered today demonstrates sinus rhythm  Recent Labs: 10/30/2023: ALT 9; BUN 8; Creatinine, Ser 0.68; Hemoglobin 12.9; Platelets 283; Potassium 4.3; Sodium 142   Recent Lipid Panel Lab Results  Component Value Date/Time   CHOL 145 08/10/2023 11:25 AM   TRIG 70 08/10/2023 11:25 AM   HDL 57 08/10/2023 11:25 AM   CHOLHDL 2.5 08/10/2023 11:25 AM   LDLCALC 74 08/10/2023 11:25 AM    Physical Exam:    VS:  BP 130/70   Pulse  69   Ht 5' 6 (1.676 m)   Wt 261 lb (118.4 kg)   LMP 11/02/2023 (Exact Date)   SpO2 95%   BMI 42.13 kg/m     Wt Readings from Last 3 Encounters:  03/25/24 261 lb (118.4 kg)  02/01/24 270 lb 12.8 oz (122.8 kg)  11/30/23 268 lb (121.6 kg)     GEN:  Well nourished, well developed in no acute distress HEENT: Normal NECK: No JVD; No carotid bruits LYMPHATICS: No lymphadenopathy CARDIAC: RRR, no murmurs, rubs, gallops RESPIRATORY:  Clear to auscultation without rales, wheezing or rhonchi  ABDOMEN: Soft, non-tender, non-distended MUSCULOSKELETAL:  No edema; No deformity  SKIN: Warm and dry NEUROLOGIC:  Alert and oriented x 3 PSYCHIATRIC:  Normal affect    Risk Assessment/Risk Calculators:                 ASSESSMENT & PLAN:    Gestational hypertension Blood pressure borderline controlled on losartan , nifedipine , and chlorthalidone . Amlodipine  discontinued due to interaction risk with nifedipine . Chlorthalidone  requires monitoring for potential uterine growth restriction. - Discontinued amlodipine . - Increased nifedipine  to 60 mg daily. - Continue chlorthalidone , potential discontinuation based on ultrasound results. - Monitor blood pressure daily at home, report if consistently above 140 mmHg. - Follow-up with pharmacist in 3-4 weeks. - Follow-up with cardiologist in 12-16 weeks.  Holosystolic murmur louder than expected in the pregnancy, will get at echo.    Patient Instructions  Medication Instructions:  STOP TAKING AMLODIPINE . INCREASE YOUR NIFEDIPINE  TO 60 MG (1 TABLET) DAILY.  Lab Work: NONE TO BE DONE TODAY.  Testing/Procedures: Your physician has requested that you have an echocardiogram. Echocardiography is a painless test that uses sound waves to create images of your heart. It provides your doctor with information about the size and shape of your heart and how well your hearts chambers and valves are working. This procedure takes approximately one hour.  There are no restrictions for this procedure. Please do NOT wear cologne, perfume, aftershave, or lotions (deodorant is allowed). Please arrive 15 minutes prior to your appointment time.  Please note: We ask at that you not bring children with you during ultrasound (echo/ vascular) testing. Due to room size and safety concerns, children are not allowed in the ultrasound rooms during exams. Our front office staff cannot provide observation of children in our lobby  area while testing is being conducted. An adult accompanying a patient to their appointment will only be allowed in the ultrasound room at the discretion of the ultrasound technician under special circumstances. We apologize for any inconvenience.   Follow-Up: At [facility], you and your health needs are our priority.  As part of our continuing mission to provide you with exceptional heart care, our providers are all part of one team.  This team includes your primary Cardiologist (physician) and Advanced Practice Providers or APPs (Physician Assistants and Nurse Practitioners) who all work together to provide you with the care you need, when you need it.  Your next appointment:   3-4 WEEKS WITH CHRIS PAVERO (04-29-24 AT 3: 30PM) 12- 16 WEEKS  Provider:   MADISON FOUNTAIN, DNP    Other Instructions:       Dispo:  Return in about 16 weeks (around 07/15/2024).   Medication Adjustments/Labs and Tests Ordered: Current medicines are reviewed at length with the patient today.  Concerns regarding medicines are outlined above.  Tests Ordered: Orders Placed This Encounter  Procedures   EKG 12-Lead   ECHOCARDIOGRAM COMPLETE   Medication Changes: Meds ordered this encounter  Medications   NIFEdipine  (PROCARDIA  XL/NIFEDICAL XL) 60 MG 24 hr tablet    Sig: Take 1 tablet (60 mg total) by mouth daily.    Dispense:  90 tablet    Refill:  1      [1]  Current Meds  Medication Sig   acetaminophen  (TYLENOL ) 500 MG tablet  Take 2 tablets (1,000 mg total) by mouth every 8 (eight) hours as needed for mild pain or moderate pain.   ASPIRIN 81 PO Take 1 tablet by mouth daily.   chlorthalidone  (HYGROTON ) 25 MG tablet Take 1 tablet (25 mg total) by mouth daily.   famotidine  (PEPCID ) 40 MG tablet Take 1 tablet (40 mg total) by mouth daily.   lidocaine  (LIDODERM ) 5 % Place 1 patch onto the skin daily. Remove & Discard patch within 12 hours or as directed by MD   montelukast  (SINGULAIR ) 10 MG tablet TAKE 1 TABLET BY MOUTH EVERY DAY   ondansetron  (ZOFRAN -ODT) 4 MG disintegrating tablet Take 1 tablet (4 mg total) by mouth every 8 (eight) hours as needed for nausea or vomiting.   Prenatal Vit-Fe Fumarate-FA (PRENATAL PO) Take 1 tablet by mouth daily.   Vitamin D , Ergocalciferol , (DRISDOL ) 1.25 MG (50000 UNIT) CAPS capsule Take 50,000 Units by mouth once a week.   [DISCONTINUED] amLODipine  (NORVASC ) 5 MG tablet Take 5 mg by mouth daily.   [DISCONTINUED] NIFEdipine  (PROCARDIA -XL/NIFEDICAL-XL) 30 MG 24 hr tablet TAKE 1 TABLET BY MOUTH EVERY DAY   "

## 2024-03-29 ENCOUNTER — Ambulatory Visit: Payer: Self-pay | Admitting: Cardiology

## 2024-03-29 ENCOUNTER — Ambulatory Visit (HOSPITAL_COMMUNITY): Admission: RE | Admit: 2024-03-29 | Discharge: 2024-03-29 | Attending: Emergency Medicine

## 2024-03-29 DIAGNOSIS — I119 Hypertensive heart disease without heart failure: Secondary | ICD-10-CM

## 2024-03-29 DIAGNOSIS — R011 Cardiac murmur, unspecified: Secondary | ICD-10-CM

## 2024-03-29 LAB — ECHOCARDIOGRAM COMPLETE
Area-P 1/2: 3.54 cm2
S' Lateral: 3.4 cm

## 2024-04-29 ENCOUNTER — Ambulatory Visit

## 2024-04-29 ENCOUNTER — Ambulatory Visit: Admitting: Pharmacist

## 2024-06-23 ENCOUNTER — Ambulatory Visit: Admitting: Emergency Medicine
